# Patient Record
Sex: Female | Born: 1995
Health system: Southern US, Community
[De-identification: ages and names within clinical notes are randomized; demographics above are authoritative.]

## PROBLEM LIST (undated history)

## (undated) DIAGNOSIS — E119 Type 2 diabetes mellitus without complications: Secondary | ICD-10-CM

## (undated) DIAGNOSIS — J45909 Unspecified asthma, uncomplicated: Secondary | ICD-10-CM

## (undated) DIAGNOSIS — T7840XA Allergy, unspecified, initial encounter: Secondary | ICD-10-CM

## (undated) HISTORY — DX: Allergy, unspecified, initial encounter: T78.40XA

## (undated) HISTORY — DX: Unspecified asthma, uncomplicated: J45.909

## (undated) HISTORY — PX: OTHER SURGICAL HISTORY: SHX169

## (undated) HISTORY — PX: OSTEOTOMY: SHX137

## (undated) HISTORY — DX: Type 2 diabetes mellitus without complications: E11.9

---
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
## 2014-02-20 ENCOUNTER — Other Ambulatory Visit: Payer: Self-pay | Admitting: Family Medicine

## 2014-03-05 ENCOUNTER — Ambulatory Visit (INDEPENDENT_AMBULATORY_CARE_PROVIDER_SITE_OTHER): Payer: BLUE CROSS/BLUE SHIELD | Admitting: Emergency Medicine

## 2014-03-05 VITALS — BP 110/80 | HR 91 | Temp 98.9°F | Resp 16 | Ht 65.5 in | Wt 188.0 lb

## 2014-03-05 DIAGNOSIS — E108 Type 1 diabetes mellitus with unspecified complications: Secondary | ICD-10-CM

## 2014-03-05 LAB — POCT CBC
GRANULOCYTE PERCENT: 36.8 % — AB (ref 37–80)
HCT, POC: 40.2 % (ref 37.7–47.9)
Hemoglobin: 13.1 g/dL (ref 12.2–16.2)
Lymph, poc: 6.5 — AB (ref 0.6–3.4)
MCH, POC: 28.2 pg (ref 27–31.2)
MCHC: 32.6 g/dL (ref 31.8–35.4)
MCV: 86.3 fL (ref 80–97)
MID (cbc): 0.7 (ref 0–0.9)
MPV: 8.1 fL (ref 0–99.8)
POC GRANULOCYTE: 4.2 (ref 2–6.9)
POC LYMPH %: 56.8 % — AB (ref 10–50)
POC MID %: 6.4 %M (ref 0–12)
Platelet Count, POC: 285 10*3/uL (ref 142–424)
RBC: 4.65 M/uL (ref 4.04–5.48)
RDW, POC: 12.9 %
WBC: 11.5 10*3/uL — AB (ref 4.6–10.2)

## 2014-03-05 LAB — POCT GLYCOSYLATED HEMOGLOBIN (HGB A1C): Hemoglobin A1C: 6.3

## 2014-03-05 LAB — GLUCOSE, POCT (MANUAL RESULT ENTRY): POC Glucose: 92 mg/dl (ref 70–99)

## 2014-03-05 NOTE — Progress Notes (Addendum)
Subjective:    Patient ID: Amanda Kent, female    DOB: 10/25/1995, 19 y.o.   MRN: [id number]  HPI Chief Complaint  Patient presents with  . Follow-up  . Diabetes   This chart was scribed for Lesle ChrisSteven Daub, MD by Andrew Auaven Small, ED Scribe. This patient was seen in room 1 and the patient's care was started at 4:10 PM.  HPI Comments: Amanda Kent is a 19 y.o. female who presents to the [facility]. Pt was diagnosed with DM last year. Pt does not check sugars very often but when she does there in the 150's. Pt is a Consulting civil engineerstudent at [employer]. Pt denies exercise but does try to watch her diet. Pt's mother is also diabetic and being seen today. Per mother, they have just joined [employer]. Mother states she tries to stay on top of the pt with checking her sugars. Pt is UTD with depo.     Past Medical History  Diagnosis Date  . Allergy   . Asthma   . Diabetes mellitus without complication    Past Surgical History  Procedure Laterality Date  . Osteotomy    . Osteomax2     Prior to Admission medications   Medication Sig Start Date End Date Taking? Authorizing Provider  medroxyPROGESTERone (DEPO-PROVERA) 150 MG/ML injection Inject 150 mg into the muscle every 3 (three) months.   Yes Historical Provider, MD  metFORMIN (GLUCOPHAGE) 500 MG tablet Take 1 tablet (500 mg total) by mouth 2 (two) times daily with a meal. 10/20/13  Yes Shade FloodJeffrey R Greene, MD    Review of Systems     Objective:   Physical Exam CONSTITUTIONAL: Well developed/well nourished, very dull but does cooperate.  HEAD: Normocephalic/atraumatic EYES: EOMI/PERRL ENMT: Mucous membranes moist NECK: supple no meningeal signs SPINE/BACK:entire spine nontender CV: S1/S2 noted, no murmurs/rubs/gallops noted LUNGS: Lungs are clear to auscultation bilaterally, no apparent distress ABDOMEN: soft, nontender, no rebound or guarding, bowel sounds noted throughout abdomen GU:no cva tenderness NEURO: Pt is  awake/alert/appropriate, moves all extremitiesx4.  No facial droop.   EXTREMITIES: pulses normal/equal, full ROM, sensation normal, pulse is normal SKIN: warm, color normal PSYCH: no abnormalities of mood noted, alert and oriented to situation  Results for orders placed or performed in visit on 03/05/14  POCT CBC  Result Value Ref Range   WBC 11.5 (A) 4.6 - 10.2 K/uL   Lymph, poc 6.5 (A) 0.6 - 3.4   POC LYMPH PERCENT 56.8 (A) 10 - 50 %L   MID (cbc) 0.7 0 - 0.9   POC MID % 6.4 0 - 12 %M   POC Granulocyte 4.2 2 - 6.9   Granulocyte percent 36.8 (A) 37 - 80 %G   RBC 4.65 4.04 - 5.48 M/uL   Hemoglobin 13.1 12.2 - 16.2 g/dL   HCT, POC 82.940.2 56.237.7 - 47.9 %   MCV 86.3 80 - 97 fL   MCH, POC 28.2 27 - 31.2 pg   MCHC 32.6 31.8 - 35.4 g/dL   RDW, POC 13.012.9 %   Platelet Count, POC 285 142 - 424 K/uL   MPV 8.1 0 - 99.8 fL  POCT glucose (manual entry)  Result Value Ref Range   POC Glucose 92 70 - 99 mg/dl  POCT glycosylated hemoglobin (Hb A1C)  Result Value Ref Range   Hemoglobin A1C 6.3     Assessment & Plan:  No change in medications recheck 4 months.I personally performed the services described in this documentation,  which was scribed in my presence. The recorded information has been reviewed and is accurate.

## 2014-03-05 NOTE — Patient Instructions (Signed)

## 2014-03-06 LAB — COMPLETE METABOLIC PANEL WITH GFR
ALBUMIN: 4.4 g/dL (ref 3.5–5.2)
ALT: 10 U/L (ref 0–35)
AST: 13 U/L (ref 0–37)
Alkaline Phosphatase: 46 U/L (ref 39–117)
BILIRUBIN TOTAL: 0.3 mg/dL (ref 0.2–1.2)
BUN: 20 mg/dL (ref 6–23)
CALCIUM: 9.5 mg/dL (ref 8.4–10.5)
CHLORIDE: 104 meq/L (ref 96–112)
CO2: 23 mEq/L (ref 19–32)
CREATININE: 0.68 mg/dL (ref 0.50–1.10)
GLUCOSE: 85 mg/dL (ref 70–99)
POTASSIUM: 4 meq/L (ref 3.5–5.3)
SODIUM: 137 meq/L (ref 135–145)
TOTAL PROTEIN: 7.4 g/dL (ref 6.0–8.3)

## 2014-03-06 LAB — TSH: TSH: 2.239 u[IU]/mL (ref 0.350–4.500)

## 2014-03-06 LAB — MICROALBUMIN, URINE: Microalb, Ur: 1.4 mg/dL (ref <2.0)

## 2014-03-06 LAB — T4, FREE: Free T4: 1.11 ng/dL (ref 0.80–1.80)

## 2014-04-29 ENCOUNTER — Other Ambulatory Visit: Payer: Self-pay | Admitting: Family Medicine

## 2014-06-05 ENCOUNTER — Other Ambulatory Visit: Payer: Self-pay | Admitting: Physician Assistant

## 2014-07-13 ENCOUNTER — Other Ambulatory Visit: Payer: Self-pay | Admitting: Physician Assistant

## 2014-08-11 ENCOUNTER — Ambulatory Visit (INDEPENDENT_AMBULATORY_CARE_PROVIDER_SITE_OTHER): Payer: 59 | Admitting: Family Medicine

## 2014-08-11 VITALS — BP 108/72 | HR 50 | Temp 99.5°F | Resp 16 | Ht 66.0 in | Wt 189.0 lb

## 2014-08-11 DIAGNOSIS — E119 Type 2 diabetes mellitus without complications: Secondary | ICD-10-CM

## 2014-08-11 DIAGNOSIS — E663 Overweight: Secondary | ICD-10-CM | POA: Diagnosis not present

## 2014-08-11 LAB — GLUCOSE, POCT (MANUAL RESULT ENTRY): POC GLUCOSE: 114 mg/dL — AB (ref 70–99)

## 2014-08-11 LAB — POCT GLYCOSYLATED HEMOGLOBIN (HGB A1C): HEMOGLOBIN A1C: 7

## 2014-08-11 MED ORDER — METFORMIN HCL 500 MG PO TABS: ORAL_TABLET | ORAL | Status: DC

## 2014-08-11 NOTE — Progress Notes (Addendum)
Subjective:  This chart was scribed for Trinna Post, MD by Broadus John, Medical Scribe. This patient was seen in Room 8 and the patient's care was started at 1:27 PM.   Patient ID: Amanda Kent, female    DOB: 03-Sep-1995, 19 y.o.   MRN: [id number]  Chief Complaint  Patient presents with  . Diabetes recheck    HPI HPI Comments: Amanda Kent is a 19 y.o. female with a history of diabetes. She was initially diagnosed in July 2015, initial A1C 11.7. She was started on metformin 500 mg BID. She was seen for a follow up in October 2015 with A1C of 7.1. She was continued on same dose of metformin. Last diabetes check was in February 27th with Dr. Cleta Alberts, A1C 6.3. She was continued on 500 mg Metformin BID. Initially weight 191. Pt is up 1 lb from visit in February. Last urine microalbumin  was normal in Feb of 2016.   Today pt presents to [facility] for a diabetes check. Pt reports that she has not seen an optometrist within the last year. She notes that she last seen her dentist last year. Pt states that she regularly exercises with her mother about 3-4 times a week by walking. She indicates that she eats fast food about once or twice a week. Pt also notes that she does not eat much sweets, and she does not drink tea or soda. Pt reports that the hardest part of her diet is the kind of food she eats.  Pt denies numbness or a tingling sensation in her feet, fatigue, headache, light-headedness, or dizziness. She reports no complications with her metformin.   Filed Weights   08/11/14 1129  Weight: 189 lb (85.73 kg)   Body mass index is 30.52 kg/(m^2).    There are no active problems to display for this patient.  Past Medical History  Diagnosis Date  . Allergy   . Asthma   . Diabetes mellitus without complication    Past Surgical History  Procedure Laterality Date  . Osteotomy    . Osteomax2     No Known Allergies Prior to Admission medications     Medication Sig Start Date End Date Taking? Authorizing Provider  medroxyPROGESTERone (DEPO-PROVERA) 150 MG/ML injection Inject 150 mg into the muscle every 3 (three) months.   Yes Historical Provider, MD  metFORMIN (GLUCOPHAGE) 500 MG tablet TAKE 1 TABLET BY MOUTH TWICE DAILY.  "OV NEEDED" 07/13/14  Yes Porfirio Oar, PA-C   History   Social History  . Marital Status: Single    Spouse Name: N/A  . Number of Children: N/A  . Years of Education: N/A   Occupational History  . Not on file.   Social History Main Topics  . Smoking status: Never Smoker   . Smokeless tobacco: Not on file  . Alcohol Use: No  . Drug Use: No  . Sexual Activity: Yes    Birth Control/ Protection: Injection   Other Topics Concern  . Not on file   Social History Narrative    Review of Systems  Constitutional: Negative for fatigue and unexpected weight change.  Respiratory: Negative for chest tightness and shortness of breath.   Cardiovascular: Negative for chest pain, palpitations and leg swelling.  Gastrointestinal: Negative for abdominal pain and blood in stool.  Neurological: Negative for dizziness, syncope, light-headedness and headaches.       Objective:   Physical Exam  Constitutional: She is oriented to person, place,  and time. She appears well-developed and well-nourished. No distress.  HENT:  Head: Normocephalic and atraumatic.  Eyes: Conjunctivae and EOM are normal. Pupils are equal, round, and reactive to light.  Neck: Neck supple. Carotid bruit is not present.  Cardiovascular: Normal rate, regular rhythm, normal heart sounds and intact distal pulses.   Pulmonary/Chest: Effort normal and breath sounds normal.  Abdominal: Soft. She exhibits no pulsatile midline mass. There is no tenderness.  Musculoskeletal:  No appreciable lower extremity edema.  Microfilament testing was normal.     Neurological: She is alert and oriented to person, place, and time. No cranial nerve deficit.  Skin:  Skin is warm and dry.  Psychiatric: She has a normal mood and affect. Her behavior is normal.  Nursing note and vitals reviewed.   Filed Vitals:   08/11/14 1129  BP: 108/72  Pulse: 50  Temp: 99.5 F (37.5 C)  TempSrc: Oral  Resp: 16  Height:  (1.676 m)  Weight: 189 lb (85.73 kg)  SpO2: 98%   Results for orders placed or performed in visit on 08/11/14  POCT glucose (manual entry)  Result Value Ref Range   POC Glucose 114 (A) 70 - 99 mg/dl  POCT glycosylated hemoglobin (Hb A1C)  Result Value Ref Range   Hemoglobin A1C 7.0        Assessment & Plan:   Amanda Kent is a 19 y.o. female Diabetes type 2, controlled - Plan: POCT glucose (manual entry), POCT glycosylated hemoglobin (Hb A1C), Lipid panel, Comprehensive metabolic panel, metFORMIN (GLUCOPHAGE) 500 MG tablet  Overweight  -Less controlled diabetes. Recommended she have her A1c to remain under 6.5 based on her young age. We'll continue same dose of metformin twice a day, but will work on diet including portion control for weight loss and recheck in 3 months time. Again discussed nutritionist that she would find this helpful, but declined at present. Recheck in 3 months.  Meds ordered this encounter  Medications  . metFORMIN (GLUCOPHAGE) 500 MG tablet    Sig: TAKE 1 TABLET BY MOUTH TWICE DAILY.    Dispense:  180 tablet    Refill:  1   Patient Instructions  Your hemoglobin A1c has increased a little bit. As we discussed I would like you to be under 6.5 for ideal control true age. We can continue same dose of metformin for now, but really work on diet, and exercise as we discussed. Recheck for repeat hemoglobin A1c in the next 3 months.  Call for appointment with your eye care provider as discussed.  Diabetes Mellitus and Food It is important for you to manage your blood sugar (glucose) level. Your blood glucose level can be greatly affected by what you eat. Eating healthier foods in the appropriate amounts  throughout the day at about the same time each day will help you control your blood glucose level. It can also help slow or prevent worsening of your diabetes mellitus. Healthy eating may even help you improve the level of your blood pressure and reach or maintain a healthy weight.  HOW CAN FOOD AFFECT ME? Carbohydrates Carbohydrates affect your blood glucose level more than any other type of food. Your dietitian will help you determine how many carbohydrates to eat at each meal and teach you how to count carbohydrates. Counting carbohydrates is important to keep your blood glucose at a healthy level, especially if you are using insulin or taking certain medicines for diabetes mellitus. Alcohol Alcohol can cause sudden decreases in blood  glucose (hypoglycemia), especially if you use insulin or take certain medicines for diabetes mellitus. Hypoglycemia can be a life-threatening condition. Symptoms of hypoglycemia (sleepiness, dizziness, and disorientation) are similar to symptoms of having too much alcohol.  If your health care provider has given you approval to drink alcohol, do so in moderation and use the following guidelines:  Women should not have more than one drink per day, and men should not have more than two drinks per day. One drink is equal to:  12 oz of beer.  5 oz of wine.  1 oz of hard liquor.  Do not drink on an empty stomach.  Keep yourself hydrated. Have water, diet soda, or unsweetened iced tea.  Regular soda, juice, and other mixers might contain a lot of carbohydrates and should be counted. WHAT FOODS ARE NOT RECOMMENDED? As you make food choices, it is important to remember that all foods are not the same. Some foods have fewer nutrients per serving than other foods, even though they might have the same number of calories or carbohydrates. It is difficult to get your body what it needs when you eat foods with fewer nutrients. Examples of foods that you should avoid that  are high in calories and carbohydrates but low in nutrients include:  Trans fats (most processed foods list trans fats on the Nutrition Facts label).  Regular soda.  Juice.  Candy.  Sweets, such as cake, pie, doughnuts, and cookies.  Fried foods. WHAT FOODS CAN I EAT? Have nutrient-rich foods, which will nourish your body and keep you healthy. The food you should eat also will depend on several factors, including:  The calories you need.  The medicines you take.  Your weight.  Your blood glucose level.  Your blood pressure level.  Your cholesterol level. You also should eat a variety of foods, including:  Protein, such as meat, poultry, fish, tofu, nuts, and seeds (lean animal proteins are best).  Fruits.  Vegetables.  Dairy products, such as milk, cheese, and yogurt (low fat is best).  Breads, grains, pasta, cereal, rice, and beans.  Fats such as olive oil, trans fat-free margarine, canola oil, avocado, and olives. DOES EVERYONE WITH DIABETES MELLITUS HAVE THE SAME MEAL PLAN? Because every person with diabetes mellitus is different, there is not one meal plan that works for everyone. It is very important that you meet with a dietitian who will help you create a meal plan that is just right for you. Document Released: 09/20/2004 Document Revised: 12/29/2012 Document Reviewed: 11/20/2012 [facility] Patient Information 2015 [place], [place]. This information is not intended to replace advice given to you by your health care provider. Make sure you discuss any questions you have with your health care provider.     I personally performed the services described in this documentation, which was scribed in my presence. The recorded information has been reviewed and considered, and addended by me as needed.

## 2014-08-11 NOTE — Patient Instructions (Addendum)
Your hemoglobin A1c has increased a little bit. As we discussed I would like you to be under 6.5 for ideal control true age. We can continue same dose of metformin for now, but really work on diet, and exercise as we discussed. Recheck for repeat hemoglobin A1c in the next 3 months.  Call for appointment with your eye care provider as discussed.  Diabetes Mellitus and Food It is important for you to manage your blood sugar (glucose) level. Your blood glucose level can be greatly affected by what you eat. Eating healthier foods in the appropriate amounts throughout the day at about the same time each day will help you control your blood glucose level. It can also help slow or prevent worsening of your diabetes mellitus. Healthy eating may even help you improve the level of your blood pressure and reach or maintain a healthy weight.  HOW CAN FOOD AFFECT ME? Carbohydrates Carbohydrates affect your blood glucose level more than any other type of food. Your dietitian will help you determine how many carbohydrates to eat at each meal and teach you how to count carbohydrates. Counting carbohydrates is important to keep your blood glucose at a healthy level, especially if you are using insulin or taking certain medicines for diabetes mellitus. Alcohol Alcohol can cause sudden decreases in blood glucose (hypoglycemia), especially if you use insulin or take certain medicines for diabetes mellitus. Hypoglycemia can be a life-threatening condition. Symptoms of hypoglycemia (sleepiness, dizziness, and disorientation) are similar to symptoms of having too much alcohol.  If your health care provider has given you approval to drink alcohol, do so in moderation and use the following guidelines:  Women should not have more than one drink per day, and men should not have more than two drinks per day. One drink is equal to:  12 oz of beer.  5 oz of wine.  1 oz of hard liquor.  Do not drink on an empty  stomach.  Keep yourself hydrated. Have water, diet soda, or unsweetened iced tea.  Regular soda, juice, and other mixers might contain a lot of carbohydrates and should be counted. WHAT FOODS ARE NOT RECOMMENDED? As you make food choices, it is important to remember that all foods are not the same. Some foods have fewer nutrients per serving than other foods, even though they might have the same number of calories or carbohydrates. It is difficult to get your body what it needs when you eat foods with fewer nutrients. Examples of foods that you should avoid that are high in calories and carbohydrates but low in nutrients include:  Trans fats (most processed foods list trans fats on the Nutrition Facts label).  Regular soda.  Juice.  Candy.  Sweets, such as cake, pie, doughnuts, and cookies.  Fried foods. WHAT FOODS CAN I EAT? Have nutrient-rich foods, which will nourish your body and keep you healthy. The food you should eat also will depend on several factors, including:  The calories you need.  The medicines you take.  Your weight.  Your blood glucose level.  Your blood pressure level.  Your cholesterol level. You also should eat a variety of foods, including:  Protein, such as meat, poultry, fish, tofu, nuts, and seeds (lean animal proteins are best).  Fruits.  Vegetables.  Dairy products, such as milk, cheese, and yogurt (low fat is best).  Breads, grains, pasta, cereal, rice, and beans.  Fats such as olive oil, trans fat-free margarine, canola oil, avocado, and olives. DOES EVERYONE WITH  DIABETES MELLITUS HAVE THE SAME MEAL PLAN? Because every person with diabetes mellitus is different, there is not one meal plan that works for everyone. It is very important that you meet with a dietitian who will help you create a meal plan that is just right for you. Document Released: 09/20/2004 Document Revised: 12/29/2012 Document Reviewed: 11/20/2012 [facility] Patient  Information 2015 [place], [place]. This information is not intended to replace advice given to you by your health care provider. Make sure you discuss any questions you have with your health care provider.

## 2015-02-06 ENCOUNTER — Ambulatory Visit (INDEPENDENT_AMBULATORY_CARE_PROVIDER_SITE_OTHER): Payer: BLUE CROSS/BLUE SHIELD | Admitting: Internal Medicine

## 2015-02-06 ENCOUNTER — Encounter: Payer: Self-pay | Admitting: Internal Medicine

## 2015-02-06 VITALS — BP 122/80 | HR 68 | Temp 99.4°F | Resp 16 | Ht 65.0 in | Wt 195.0 lb

## 2015-02-06 DIAGNOSIS — Z6832 Body mass index (BMI) 32.0-32.9, adult: Secondary | ICD-10-CM

## 2015-02-06 DIAGNOSIS — E119 Type 2 diabetes mellitus without complications: Secondary | ICD-10-CM

## 2015-02-06 LAB — LIPID PANEL
CHOL/HDL RATIO: 5.5 ratio — AB (ref <=5.0)
Cholesterol: 159 mg/dL (ref 125–170)
HDL: 29 mg/dL — ABNORMAL LOW (ref >=46)
LDL CALC: 111 mg/dL — AB (ref <110)
Triglycerides: 93 mg/dL (ref <150)
VLDL: 19 mg/dL (ref <30)

## 2015-02-06 LAB — POCT GLYCOSYLATED HEMOGLOBIN (HGB A1C): HEMOGLOBIN A1C: 8.1

## 2015-02-06 NOTE — Patient Instructions (Signed)
Whole 30 diet 

## 2015-02-06 NOTE — Progress Notes (Signed)
Subjective:  By signing my name below, I, Amanda Kent, attest that this documentation has been prepared under the direction and in the presence of [name], MD.  Watt Climes Kent, Medical Scribe. 02/06/2015.  3:33 PM.   Patient ID: Amanda Kent, female    DOB: [birth year], 20 y.o.   MRN: [id number]  Chief Complaint  Patient presents with  . Annual Exam    complete physical     HPI HPI Comments: Amanda Kent is a 20 y.o. female who presents to [facility] for fu med probs At last OV Dr Green:Lashaun R Terhaar is a 20 y.o. female with a history of diabetes. She was initially diagnosed in July 2015, initial A1C 11.7. She was started on metformin 500 mg BID. She was seen for a follow up in October 2015 with A1C of 7.1. She was continued on same dose of metformin. Last diabetes check was in February 27th with Dr. Cleta Alberts, A1C 6.3. She was continued on 500 mg Metformin BID. Initially weight 191. Pt is up 1 lb from visit in February. Last urine microalbumin was normal in Feb of 2016.   Note glycosuria in [facility] visit a yr before dx  There are no active problems to display for this patient.  Pt is requesting a diabetic check. She indicates that she started taking metformin about 1 year ago. Pt notes that she has lost a few pounds since she started being on the medication. She states that she walks regularly for exercise. Pt reports no other medical problems. She denies eye issues, or sleep disturbance.   Pt is currently as Consulting civil engineer at [employer], studying Early childhood Education.    There are no active problems to display for this patient.  Past Medical History  Diagnosis Date  . Allergy   . Asthma   . Diabetes mellitus without complication    Past Surgical History  Procedure Laterality Date  . Osteotomy    . Osteomax2     No Known Allergies Prior to Admission medications   Medication Sig Start Date End Date Taking? Authorizing Provider    medroxyPROGESTERone (DEPO-PROVERA) 150 MG/ML injection Inject 150 mg into the muscle every 3 (three) months.    Historical Provider, MD  metFORMIN (GLUCOPHAGE) 500 MG tablet TAKE 1 TABLET BY MOUTH TWICE DAILY. 08/11/14   Shade Flood, MD   Social History   Social History  . Marital Status: Single    Spouse Name: N/A  . Number of Children: N/A  . Years of Education: N/A   Occupational History  . Not on file.   Social History Main Topics  . Smoking status: Never Smoker   . Smokeless tobacco: Not on file  . Alcohol Use: No  . Drug Use: No  . Sexual Activity: Yes    Birth Control/ Protection: Injection   Other Topics Concern  . Not on file   Social History Narrative    Review of Systems  Constitutional: Negative for appetite change, fatigue and unexpected weight change.  HENT: Negative for trouble swallowing.   Eyes: Negative for pain and visual disturbance.  Respiratory: Negative for shortness of breath.   Cardiovascular: Negative for chest pain, palpitations and leg swelling.  Gastrointestinal: Negative for abdominal pain.  Genitourinary: Negative for difficulty urinating and menstrual problem.  Musculoskeletal: Negative for back pain.  Neurological: Negative for headaches.  Psychiatric/Behavioral: Negative for behavioral problems, sleep disturbance and dysphoric mood. The patient is not nervous/anxious.  Objective:   Physical Exam  Constitutional: She is oriented to person, place, and time. She appears well-developed and well-nourished. No distress.  HENT:  Head: Normocephalic and atraumatic.  Mouth/Throat: Oropharynx is clear and moist.  Eyes: Conjunctivae and EOM are normal. Pupils are equal, round, and reactive to light.  Neck: Neck supple. No thyromegaly present.  Cardiovascular: Normal rate, regular rhythm, normal heart sounds and intact distal pulses.   No murmur heard. Pulmonary/Chest: Effort normal and breath sounds normal.  Neurological: She is  alert and oriented to person, place, and time. She has normal reflexes. No cranial nerve deficit. Coordination normal.  Skin: Skin is warm and dry.  Psychiatric: She has a normal mood and affect. Her behavior is normal. Thought content normal.  Nursing note and vitals reviewed.   BP 122/80 mmHg  Pulse 68  Temp(Src) 99.4 F (37.4 C) (Oral)  Resp 16  Ht  (1.651 m)  Wt 195 lb (88.451 kg)  BMI 32.45 kg/m2  SpO2 98%     Assessment & Plan:  Type 2 diabetes mellitus without complication, without long-term current use of insulin (HCC) - Plan: POCT glycosylated hemoglobin (Hb A1C), Lipid panel  BMI 32.0-32.9,adult  To focus on wt loss via whole 30 plus exercise  Results for orders placed or performed in visit on 02/06/15  Lipid panel  Result Value Ref Range   Cholesterol 159 125 - 170 mg/dL   Triglycerides 93 <098 mg/dL   HDL 29 (L) >=11 mg/dL   Total CHOL/HDL Ratio 5.5 (H) <=5.0 Ratio   VLDL 19 <30 mg/dL   LDL Cholesterol 914 (H) <110 mg/dL  POCT glycosylated hemoglobin (Hb A1C)  Result Value Ref Range   Hemoglobin A1C 8.1    Will see if dm control reduces LDL for now Consider ACE and ststin Optometry with Kent  Meds ordered this encounter  Medications  . metFORMIN (GLUCOPHAGE) 1000 MG tablet    Sig: TAKE 1 TABLET BY MOUTH TWICE DAILY.    Dispense:  180 tablet    Refill:  1   Fu 6 m I have completed the patient encounter in its entirety as documented by the scribe, with editing by me where necessary. Oskar Cretella P. Merla Riches, M.D.

## 2015-02-07 ENCOUNTER — Encounter: Payer: Self-pay | Admitting: Internal Medicine

## 2015-02-07 DIAGNOSIS — Z6832 Body mass index (BMI) 32.0-32.9, adult: Secondary | ICD-10-CM | POA: Insufficient documentation

## 2015-02-07 DIAGNOSIS — E119 Type 2 diabetes mellitus without complications: Secondary | ICD-10-CM | POA: Insufficient documentation

## 2015-02-07 MED ORDER — METFORMIN HCL 1000 MG PO TABS: ORAL_TABLET | ORAL | Status: DC

## 2015-02-08 ENCOUNTER — Telehealth: Payer: Self-pay

## 2015-02-08 NOTE — Telephone Encounter (Signed)
-----   Message from Tonye Pearson, MD sent at 02/07/2015  1:13 PM EST ----- Let her know I sent in new rx for metformin at higher dose as A1C was 8 yesterday   Reck 6 mos

## 2015-02-08 NOTE — Telephone Encounter (Signed)
Called and advised pt on VM of new dose and f/up plan. Asked for CB w/any ?s.

## 2015-06-12 ENCOUNTER — Other Ambulatory Visit: Payer: Self-pay | Admitting: Family Medicine

## 2015-07-14 ENCOUNTER — Ambulatory Visit (INDEPENDENT_AMBULATORY_CARE_PROVIDER_SITE_OTHER): Payer: BLUE CROSS/BLUE SHIELD | Admitting: *Deleted

## 2015-07-14 VITALS — BP 114/76 | HR 74 | Temp 98.5°F | Ht 65.0 in | Wt 193.0 lb

## 2015-07-14 DIAGNOSIS — Z3042 Encounter for surveillance of injectable contraceptive: Secondary | ICD-10-CM

## 2015-07-14 MED ORDER — MEDROXYPROGESTERONE ACETATE 150 MG/ML IM SUSP
150.0000 mg | Freq: Once | INTRAMUSCULAR | Status: AC
  Administered 2015-07-14: 150 mg via INTRAMUSCULAR

## 2015-07-14 NOTE — Progress Notes (Signed)
Pt is new to our office, Dr Gaynell FaceMarshall transfer. Pt is here today for her depo injection. Pt is on time for her injection.  Pt was advised of this office policy on depo for contraception.   Pt was advised that she will need an AEX with or before her next depo injection.  Injection was given, pt tolerated well.  Pt advised to RTO on 10-05-15 for next depo. Pt states understanding.  BP 114/76 mmHg  Pulse 74  Temp(Src) 98.5 F (36.9 C)  Ht 5\' 5"  (1.651 m)  Wt 193 lb (87.544 kg)  BMI 32.12 kg/m2  Administrations This Visit    medroxyPROGESTERone (DEPO-PROVERA) injection 150 mg    Admin Date Action Dose Route Administered By         07/14/2015 Given 150 mg Intramuscular Lanney GinsSuzanne D Reeve Mallo, CMA

## 2015-09-29 LAB — LAB REPORT - SCANNED

## 2015-10-03 ENCOUNTER — Other Ambulatory Visit: Payer: Self-pay

## 2015-10-03 ENCOUNTER — Other Ambulatory Visit: Payer: Self-pay | Admitting: Physician Assistant

## 2015-10-03 MED ORDER — MEDROXYPROGESTERONE ACETATE 150 MG/ML IM SUSP
150.0000 mg | INTRAMUSCULAR | 0 refills | Status: DC
Start: 2015-10-03 — End: 2015-10-06

## 2015-10-03 NOTE — Progress Notes (Unsigned)
One Depo refill sent to pharmacy.

## 2015-10-06 ENCOUNTER — Ambulatory Visit (INDEPENDENT_AMBULATORY_CARE_PROVIDER_SITE_OTHER): Payer: BLUE CROSS/BLUE SHIELD | Admitting: Certified Nurse Midwife

## 2015-10-06 ENCOUNTER — Encounter: Payer: Self-pay | Admitting: Certified Nurse Midwife

## 2015-10-06 VITALS — BP 123/82 | HR 78 | Temp 98.6°F | Wt 191.4 lb

## 2015-10-06 DIAGNOSIS — Z3042 Encounter for surveillance of injectable contraceptive: Secondary | ICD-10-CM | POA: Diagnosis not present

## 2015-10-06 DIAGNOSIS — Z01419 Encounter for gynecological examination (general) (routine) without abnormal findings: Secondary | ICD-10-CM

## 2015-10-06 DIAGNOSIS — E119 Type 2 diabetes mellitus without complications: Secondary | ICD-10-CM

## 2015-10-06 DIAGNOSIS — Z113 Encounter for screening for infections with a predominantly sexual mode of transmission: Secondary | ICD-10-CM

## 2015-10-06 MED ORDER — MEDROXYPROGESTERONE ACETATE 150 MG/ML IM SUSP: 150.0000 mg | INTRAMUSCULAR | 2 refills | Status: DC

## 2015-10-06 MED ORDER — MEDROXYPROGESTERONE ACETATE 150 MG/ML IM SUSP
150.0000 mg | Freq: Once | INTRAMUSCULAR | Status: AC
  Administered 2015-10-06: 150 mg via INTRAMUSCULAR

## 2015-10-06 NOTE — Progress Notes (Signed)
Subjective:      Amanda Kent is a 20 y.o. female here for a routine exam.  Current complaints: none.  Has been on Depo injections since she was 85-34 years of age.  Denies any hx of AUB or irregular periods.  Discussed Depo holiday since she has been on Depo injections for some time.  Does not have a primary care provider.  Is employed and going to school.   Last year HGA1C: was 6.2 last year.   Desires full STD screening exam.    Personal health questionnaire:  Is patient [name], have a family history of breast and/or ovarian cancer: no Is there a family history of uterine cancer diagnosed at age < 68, gastrointestinal cancer, urinary tract cancer, family member who is a Personnel officer syndrome-associated carrier: no Is the patient overweight and hypertensive, family history of diabetes, personal history of gestational diabetes, preeclampsia or PCOS: yes Is patient over 25, have PCOS,  family history of premature CHD under age 55, diabetes, smoke, have hypertension or peripheral artery disease:  yes At any time, has a partner hit, kicked or otherwise hurt or frightened you?: no Over the past 2 weeks, have you felt down, depressed or hopeless?: no Over the past 2 weeks, have you felt little interest or pleasure in doing things?:no   Gynecologic History No LMP recorded. Patient has had an injection. Contraception: Depo-Provera injections Last Pap: 04/12/14. Results were: normal Last mammogram: N/A.   Obstetric History OB History  No data available    Past Medical History:  Diagnosis Date  . Allergy   . Asthma   . Diabetes mellitus without complication [facility])     Past Surgical History:  Procedure Laterality Date  . osteomax2    . OSTEOTOMY       Current Outpatient Prescriptions:  .  medroxyPROGESTERone (DEPO-PROVERA) 150 MG/ML injection, Inject 1 mL (150 mg total) into the muscle every 3 (three) months., Disp: 1 mL, Rfl: 2 .  metFORMIN (GLUCOPHAGE) 500 MG tablet, TAKE 1  TABLET BY MOUTH TWICE DAILY, Disp: 30 tablet, Rfl: 0 No Known Allergies  Social History  Substance Use Topics  . Smoking status: Never Smoker  . Smokeless tobacco: Never Used  . Alcohol use No    Family History  Problem Relation Age of Onset  . Diabetes Mother   . Heart disease Mother   . Hypertension Mother       Review of Systems  Constitutional: negative for fatigue and weight loss Respiratory: negative for cough and wheezing Cardiovascular: negative for chest pain, fatigue and palpitations Gastrointestinal: negative for abdominal pain and change in bowel habits Musculoskeletal:negative for myalgias Neurological: negative for gait problems and tremors Behavioral/Psych: negative for abusive relationship, depression Endocrine: negative for temperature intolerance   Genitourinary:negative for abnormal menstrual periods, genital lesions, hot flashes, sexual problems and vaginal discharge Integument/breast: negative for breast lump, breast tenderness, nipple discharge and skin lesion(s)    Objective:       BP 123/82   Pulse 78   Temp 98.6 F (37 C) (Oral)   Wt 191 lb 6.4 oz (86.8 kg)   BMI 31.85 kg/m  General:   alert  Skin:   no rash or abnormalities  Lungs:   clear to auscultation bilaterally  Heart:   regular rate and rhythm, S1, S2 normal, no murmur, click, rub or gallop  Breasts:   normal without suspicious masses, skin or nipple changes or axillary nodes  Abdomen:  normal findings: no organomegaly, soft,  non-tender and no hernia  Pelvis:  External genitalia: normal general appearance Urinary system: urethral meatus normal and bladder without fullness, nontender Vaginal: normal without tenderness, induration or masses Cervix: normal appearance Adnexa: normal bimanual exam Uterus: anteverted and non-tender, normal size   Lab Review Urine pregnancy test Labs reviewed yes Radiologic studies reviewed no  50% of 30 min visit spent on counseling and coordination  of care.   Assessment:    Healthy female exam.   Contraception management  STD screening exam   H/O DM2 Plan:    Education reviewed: calcium supplements, depression evaluation, low fat, low cholesterol diet, safe sex/STD prevention, self breast exams, skin cancer screening and weight bearing exercise. Contraception: Depo-Provera injections. Follow up in: 6 months.   Meds ordered this encounter  Medications  . medroxyPROGESTERone (DEPO-PROVERA) 150 MG/ML injection    Sig: Inject 1 mL (150 mg total) into the muscle every 3 (three) months.    Dispense:  1 mL    Refill:  2   Orders Placed This Encounter  Procedures  . Hepatitis B surface antigen  . RPR  . Hepatitis C antibody  . HIV antibody  . Hemoglobin A1c  . Ambulatory referral to Internal Medicine    Referral Priority:   Routine    Referral Type:   Consultation    Referral Reason:   Specialty Services Required    Requested Specialty:   Internal Medicine    Number of Visits Requested:   1   Need to obtain previous records Possible management options include:LARK: ie Nexplanon Follow up in February to switch contraception.

## 2015-10-06 NOTE — Addendum Note (Signed)
Addended by: Marya LandryFOSTER, Alieu Finnigan D on: 10/06/2015 04:30 PM   Modules accepted: Orders

## 2015-10-10 ENCOUNTER — Other Ambulatory Visit: Payer: Self-pay | Admitting: Certified Nurse Midwife

## 2015-10-10 ENCOUNTER — Ambulatory Visit: Payer: BLUE CROSS/BLUE SHIELD | Admitting: Certified Nurse Midwife

## 2015-10-10 DIAGNOSIS — B9689 Other specified bacterial agents as the cause of diseases classified elsewhere: Secondary | ICD-10-CM

## 2015-10-10 DIAGNOSIS — N760 Acute vaginitis: Principal | ICD-10-CM

## 2015-10-10 LAB — NUSWAB VG, CANDIDA 6SP
CANDIDA GLABRATA, NAA: NEGATIVE
CANDIDA KRUSEI, NAA: NEGATIVE
CANDIDA LUSITANIAE, NAA: NEGATIVE
CANDIDA PARAPSILOSIS, NAA: NEGATIVE
CANDIDA TROPICALIS, NAA: NEGATIVE
Candida albicans, NAA: NEGATIVE
Megasphaera 1: HIGH Score — AB
Trich vag by NAA: NEGATIVE

## 2015-10-10 MED ORDER — METRONIDAZOLE 500 MG PO TABS: 500.0000 mg | ORAL_TABLET | Freq: Two times a day (BID) | ORAL | 0 refills | Status: DC

## 2015-10-12 LAB — PAP IG W/ RFLX HPV ASCU: PAP SMEAR COMMENT: 0

## 2015-10-23 ENCOUNTER — Encounter: Payer: Self-pay | Admitting: *Deleted

## 2015-11-09 ENCOUNTER — Other Ambulatory Visit: Payer: Self-pay

## 2015-11-09 MED ORDER — METFORMIN HCL 500 MG PO TABS: 500.0000 mg | ORAL_TABLET | Freq: Two times a day (BID) | ORAL | 0 refills | Status: DC

## 2015-11-09 NOTE — Telephone Encounter (Signed)
Amanda Kent asked me to check on this pt to see if we have received a  req for refill of metforming. I advised that we have not gotten a request, but pt is overdue for f/up and needs to est care with new provider. I had Corrie DandyMary advise that I will send 1 wk refill to give her time to get in. Done.

## 2015-11-21 ENCOUNTER — Ambulatory Visit (INDEPENDENT_AMBULATORY_CARE_PROVIDER_SITE_OTHER): Payer: BLUE CROSS/BLUE SHIELD | Admitting: Physician Assistant

## 2015-11-21 VITALS — BP 120/74 | HR 96 | Temp 98.3°F | Resp 16 | Ht 64.0 in | Wt 190.6 lb

## 2015-11-21 DIAGNOSIS — Z6832 Body mass index (BMI) 32.0-32.9, adult: Secondary | ICD-10-CM

## 2015-11-21 DIAGNOSIS — E119 Type 2 diabetes mellitus without complications: Secondary | ICD-10-CM | POA: Diagnosis not present

## 2015-11-21 LAB — COMPLETE METABOLIC PANEL WITHOUT GFR
ALT: 9 U/L (ref 6–29)
AST: 13 U/L (ref 10–30)
Albumin: 4.7 g/dL (ref 3.6–5.1)
Alkaline Phosphatase: 55 U/L (ref 33–115)
BUN: 19 mg/dL (ref 7–25)
CO2: 25 mmol/L (ref 20–31)
Glucose, Bld: 127 mg/dL — ABNORMAL HIGH (ref 65–99)
Sodium: 136 mmol/L (ref 135–146)
Total Bilirubin: 0.6 mg/dL (ref 0.2–1.2)
Total Protein: 7.6 g/dL (ref 6.1–8.1)

## 2015-11-21 LAB — COMPLETE METABOLIC PANEL WITH GFR
Calcium: 9.8 mg/dL (ref 8.6–10.2)
Chloride: 103 mmol/L (ref 98–110)
Creat: 0.75 mg/dL (ref 0.50–1.10)
GFR, Est African American: >89 mL/min (ref >=60)
GFR, Est Non African American: >89 mL/min (ref >=60)
Potassium: 4.1 mmol/L (ref 3.5–5.3)

## 2015-11-21 LAB — POCT GLYCOSYLATED HEMOGLOBIN (HGB A1C): Hemoglobin A1C: 7.1

## 2015-11-21 LAB — LIPID PANEL
Cholesterol: 143 mg/dL (ref <200)
HDL: 33 mg/dL — ABNORMAL LOW (ref >50)
LDL Cholesterol: 99 mg/dL (ref <100)
Total CHOL/HDL Ratio: 4.3 Ratio (ref <5.0)
Triglycerides: 56 mg/dL (ref <150)
VLDL: 11 mg/dL (ref <30)

## 2015-11-21 LAB — GLUCOSE, POCT (MANUAL RESULT ENTRY): POC Glucose: 135 mg/dl — AB (ref 70–99)

## 2015-11-21 MED ORDER — METFORMIN HCL 1000 MG PO TABS: 1000.0000 mg | ORAL_TABLET | Freq: Two times a day (BID) | ORAL | 3 refills | Status: DC

## 2015-11-21 NOTE — Patient Instructions (Addendum)
Please increase your daily dose of Metformin to 1000 MG twice a day. Please make an effort to eat a healthy diet - this plus exercise is the best thing you can do to control your sugars. See below for tips about food choices.  If your A1C continues to rise at your future appointments we will have to add another medication to your treatment.     IF you received an x-ray today, you will receive an invoice from [facility]. Please contact [facility] at [phone] with questions or concerns regarding your invoice.   IF you received labwork today, you will receive an invoice from [facility]. Please contact [facility] at [phone] with questions or concerns regarding your invoice.   Our billing staff will not be able to assist you with questions regarding bills from these companies.  You will be contacted with the lab results as soon as they are available. The fastest way to get your results is to activate your My Chart account. Instructions are located on the last page of this paperwork. If you have not heard from us regarding the results in 2 weeks, please contact this office.     Diabetes Mellitus and Food It is important for you to manage your blood sugar (glucose) level. Your blood glucose level can be greatly affected by what you eat. Eating healthier foods in the appropriate amounts throughout the day at about the same time each day will help you control your blood glucose level. It can also help slow or prevent worsening of your diabetes mellitus. Healthy eating may even help you improve the level of your blood pressure and reach or maintain a healthy weight. General recommendations for healthful eating and cooking habits include:  Eating meals and snacks regularly. Avoid going long periods of time without eating to lose weight.  Eating a diet that consists mainly of plant-based foods, such as fruits, vegetables, nuts, legumes, and whole  grains.  Using low-heat cooking methods, such as baking, instead of high-heat cooking methods, such as deep frying. Work with your dietitian to make sure you understand how to use the Nutrition Facts information on food labels.  What foods are not recommended? As you make food choices, it is important to remember that all foods are not the same. Some foods have fewer nutrients per serving than other foods, even though they might have the same number of calories or carbohydrates. It is difficult to get your body what it needs when you eat foods with fewer nutrients. Examples of foods that you should avoid that are high in calories and carbohydrates but low in nutrients include:  Trans fats (most processed foods list trans fats on the Nutrition Facts label).  Regular soda.  Juice.  Candy.  Sweets, such as cake, pie, doughnuts, and cookies.  Fried foods. What foods can I eat? Eat nutrient-rich foods, which will nourish your body and keep you healthy. You should eat a variety of foods, including:  Protein.  Lean cuts of meat.  Proteins low in saturated fats, such as fish, egg whites, and beans. Avoid processed meats.  Fruits and vegetables.  Fruits and vegetables that may help control blood glucose levels, such as apples, mangoes, and yams.  Dairy products.  Choose fat-free or low-fat dairy products, such as milk, yogurt, and cheese.  Grains, bread, pasta, and rice.  Choose whole grain products, such as multigrain bread, whole oats, and brown rice. These foods may help control blood pressure.  Fats.  Foods containing healthful fats, such as nuts, avocado, olive oil, canola oil, and fish.   Carbohydrate Counting for Diabetes Mellitus, Adult Carbohydrate counting is a method for keeping track of how many carbohydrates you eat. Eating carbohydrates naturally increases the amount of sugar (glucose) in the blood. Counting how many carbohydrates you eat helps keep your blood  glucose within normal limits, which helps you manage your diabetes (diabetes mellitus). It is important to know how many carbohydrates you can safely have in each meal. This is different for every person. A diet and nutrition specialist (registered dietitian) can help you make a meal plan and calculate how many carbohydrates you should have at each meal and snack. Carbohydrates are found in the following foods:  Grains, such as breads and cereals.  Dried beans and soy products.  Starchy vegetables, such as potatoes, peas, and corn.  Fruit and fruit juices.  Milk and yogurt.  Sweets and snack foods, such as cake, cookies, candy, chips, and soft drinks. How do I count carbohydrates? There are two ways to count carbohydrates in food. You can use either of the methods or a combination of both. Reading "Nutrition Facts" on packaged food  The "Nutrition Facts" list is included on the labels of almost all packaged foods and beverages in the [place] It includes:  The serving size.  Information about nutrients in each serving, including the grams (g) of carbohydrate per serving. To use the "Nutrition Facts":  Decide how many servings you will have.  Multiply the number of servings by the number of carbohydrates per serving.  The resulting number is the total amount of carbohydrates that you will be having. Learning standard serving sizes of other foods  When you eat foods containing carbohydrates that are not packaged or do not include "Nutrition Facts" on the label, you need to measure the servings in order to count the amount of carbohydrates:  Measure the foods that you will eat with a food scale or measuring cup, if needed.  Decide how many standard-size servings you will eat.  Multiply the number of servings by 15. Most carbohydrate-rich foods have about 15 g of carbohydrates per serving.  For example, if you eat 8 oz (170 g) of strawberries, you will have eaten 2 servings and 30 g of  carbohydrates (2 servings x 15 g = 30 g).  For foods that have more than one food mixed, such as soups and casseroles, you must count the carbohydrates in each food that is included. The following list contains standard serving sizes of common carbohydrate-rich foods. Each of these servings has about 15 g of carbohydrates:   hamburger bun or  English muffin.   oz (15 mL) syrup.   oz (14 g) jelly.  1 slice of bread.  1 six-inch tortilla.  3 oz (85 g) cooked rice or pasta.  4 oz (113 g) cooked dried beans.  4 oz (113 g) starchy vegetable, such as peas, corn, or potatoes.  4 oz (113 g) hot cereal.  4 oz (113 g) mashed potatoes or  of a large baked potato.  4 oz (113 g) canned or frozen fruit.  4 oz (120 mL) fruit juice.  4-6 crackers.  6 chicken nuggets.  6 oz (170 g) unsweetened dry cereal.  6 oz (170 g) plain fat-free yogurt or yogurt sweetened with artificial sweeteners.  8 oz (240 mL) milk.  8 oz (170 g) fresh fruit or one small piece of fruit.  24 oz (680 g) popped  popcorn. Example of carbohydrate counting Sample meal  3 oz (85 g) chicken breast.  6 oz (170 g) brown rice.  4 oz (113 g) corn.  8 oz (240 mL) milk.  8 oz (170 g) strawberries with sugar-free whipped topping. Carbohydrate calculation 1. Identify the foods that contain carbohydrates:  Rice.  Corn.  Milk.  Strawberries. 2. Calculate how many servings you have of each food:  2 servings rice.  1 serving corn.  1 serving milk.  1 serving strawberries. 3. Multiply each number of servings by 15 g:  2 servings rice x 15 g = 30 g.  1 serving corn x 15 g = 15 g.  1 serving milk x 15 g = 15 g.  1 serving strawberries x 15 g = 15 g. 4. Add together all of the amounts to find the total grams of carbohydrates eaten:  30 g + 15 g + 15 g + 15 g = 75 g of carbohydrates total. This information is not intended to replace advice given to you by your health care provider. Make sure  you discuss any questions you have with your health care provider.

## 2015-11-21 NOTE — Progress Notes (Signed)
Amanda Kent  MRN: [id number] DOB: 18-Sep-1995  PCP: Amanda PalingEmily H Thompson, MD  Subjective:  Pt is a 20 year old female who presents to clinic for follow-up diabetes.  She was last here nine months ago, seen by Dr. Merla Kent. A1C at that time was 8.1. She is currently taking Metformin 500 mg BID, however Dr. Netta Kent's not requested her to take 1000 MG BID.  Does not exercise, due to her busy schedule of school and work.  Tries to eat healthy, but is not doing a good job. Eats egg sandwiches, sandwiches.  Does not check home sugars.  Denies abdominal pain, vision changes, nausea, vomiting, increased urination, numbness, tingling.   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 Is trying to transfer to [employer] next year for their four-year course. 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   Review of Systems  Constitutional: Negative for chills, diaphoresis, fatigue and fever.  HENT: Negative for congestion, postnasal drip, rhinorrhea, sinus pressure, sneezing and sore throat.   Respiratory: Negative for cough, chest tightness, shortness of breath and wheezing.   Cardiovascular: Negative for chest pain and palpitations.  Gastrointestinal: Negative for abdominal pain, diarrhea, nausea and vomiting.  Endocrine: Negative for polydipsia, polyphagia and polyuria.  Neurological: Negative for weakness, light-headedness and headaches.    Patient Active Problem List   Diagnosis Date Noted  . Type 2 diabetes mellitus without complication, without long-term current use of insulin (HCC) 02/07/2015  . BMI 32.0-32.9,adult 02/07/2015    Current Outpatient Prescriptions on File Prior to Visit  Medication Sig Dispense Refill  . medroxyPROGESTERone (DEPO-PROVERA) 150 MG/ML injection Inject 1 mL (150 mg total) into the muscle every 3 (three) months. 1 mL 2  . metFORMIN (GLUCOPHAGE) 500 MG tablet Take 1 tablet (500 mg total) by mouth 2 (two) times daily. 14 tablet 0   No current  facility-administered medications on file prior to visit.     No Known Allergies   Objective:  BP 120/74 (BP Location: Right Arm, Patient Position: Sitting, Cuff Size: Large)   Pulse 96   Temp 98.3 F (36.8 C) (Oral)   Resp 16   Ht 5\' 4"  (1.626 m)   Wt 190 lb 9.6 oz (86.5 kg)   SpO2 99%   BMI 32.72 kg/m   Physical Exam  Constitutional: She is oriented to person, place, and time and well-developed, well-nourished, and in no distress. No distress.  Cardiovascular: Normal rate, regular rhythm and normal heart sounds.   Pulmonary/Chest: Effort normal. No respiratory distress.  Neurological: She is alert and oriented to person, place, and time. GCS score is 15.  Skin: Skin is warm and dry.  Psychiatric: Mood, memory, affect and judgment normal.  Vitals reviewed.  Results for orders placed or performed in visit on 11/21/15  POCT glycosylated hemoglobin (Hb A1C)  Result Value Ref Range   Hemoglobin A1C 7.1   POCT glucose (manual entry)  Result Value Ref Range   POC Glucose 135 (A) 70 - 99 mg/dl    Assessment and Plan :  1. Type 2 diabetes mellitus without complication, without long-term current use of insulin (HCC) 2. BMI 32.0-32.9,adult - POCT glycosylated hemoglobin (Hb A1C) - Lipid panel - COMPLETE METABOLIC PANEL WITH GFR - Microalbumin, urine - POCT glucose (manual entry) - metFORMIN (GLUCOPHAGE) 1000 MG tablet; Take 1 tablet (1,000 mg total) by mouth 2 (two) times daily with a meal.  Dispense: 180 tablet; Refill: 3 - Increase her daily dose of Metformin to 1000 MG  BID. Discussed with patient importance of aggressive control of her blood sugar considering her young age. Encouraged eating a better diet and getting regular exercise. Will contact patient with lipid results, consider Statin therapy. RTC in three moths for f/u.    Amanda CollieWhitney Amanda Kilian, PA-C  [facility] 11/21/2015 1:45 PM

## 2015-11-22 LAB — MICROALBUMIN, URINE: Microalb, Ur: 1.5 mg/dL

## 2015-11-24 ENCOUNTER — Encounter: Payer: Self-pay | Admitting: Physician Assistant

## 2016-01-02 ENCOUNTER — Ambulatory Visit (INDEPENDENT_AMBULATORY_CARE_PROVIDER_SITE_OTHER): Payer: BLUE CROSS/BLUE SHIELD

## 2016-01-02 DIAGNOSIS — Z3042 Encounter for surveillance of injectable contraceptive: Secondary | ICD-10-CM | POA: Diagnosis not present

## 2016-01-02 MED ORDER — MEDROXYPROGESTERONE ACETATE 150 MG/ML IM SUSP
150.0000 mg | Freq: Once | INTRAMUSCULAR | Status: AC
  Administered 2016-01-02: 150 mg via INTRAMUSCULAR

## 2016-01-02 NOTE — Progress Notes (Signed)
Next Depo Due March 13-27

## 2016-03-19 ENCOUNTER — Ambulatory Visit (INDEPENDENT_AMBULATORY_CARE_PROVIDER_SITE_OTHER): Payer: BLUE CROSS/BLUE SHIELD

## 2016-03-19 VITALS — BP 134/80 | HR 89 | Wt 190.0 lb

## 2016-03-19 DIAGNOSIS — Z3042 Encounter for surveillance of injectable contraceptive: Secondary | ICD-10-CM

## 2016-03-19 MED ORDER — MEDROXYPROGESTERONE ACETATE 150 MG/ML IM SUSP
150.0000 mg | Freq: Once | INTRAMUSCULAR | Status: AC
  Administered 2016-03-19: 150 mg via INTRAMUSCULAR

## 2016-03-19 NOTE — Progress Notes (Signed)
Patient presents for DEPO Injection. Injection given in RUOQ. Tolerated well.    Next Injection 5/29-6/12/2016  Administrations This Visit    medroxyPROGESTERone (DEPO-PROVERA) injection 150 mg    Admin Date 03/19/2016 Action Given Dose 150 mg Route Intramuscular Administered By Maretta Beesarol J Maahi Lannan, RMA

## 2016-06-02 ENCOUNTER — Other Ambulatory Visit: Payer: Self-pay | Admitting: Obstetrics

## 2016-06-04 ENCOUNTER — Ambulatory Visit (INDEPENDENT_AMBULATORY_CARE_PROVIDER_SITE_OTHER): Payer: BLUE CROSS/BLUE SHIELD

## 2016-06-04 ENCOUNTER — Other Ambulatory Visit (HOSPITAL_COMMUNITY)
Admission: RE | Admit: 2016-06-04 | Discharge: 2016-06-04 | Disposition: A | Discharge status: Home / Self Care | Payer: BLUE CROSS/BLUE SHIELD | Source: Ambulatory Visit | Attending: Certified Nurse Midwife | Admitting: Certified Nurse Midwife

## 2016-06-04 DIAGNOSIS — N898 Other specified noninflammatory disorders of vagina: Secondary | ICD-10-CM | POA: Insufficient documentation

## 2016-06-04 DIAGNOSIS — Z113 Encounter for screening for infections with a predominantly sexual mode of transmission: Secondary | ICD-10-CM

## 2016-06-04 DIAGNOSIS — Z3042 Encounter for surveillance of injectable contraceptive: Secondary | ICD-10-CM | POA: Diagnosis not present

## 2016-06-04 MED ORDER — MEDROXYPROGESTERONE ACETATE 150 MG/ML IM SUSP
150.0000 mg | Freq: Once | INTRAMUSCULAR | Status: AC
  Administered 2016-06-04: 150 mg via INTRAMUSCULAR

## 2016-06-04 NOTE — Progress Notes (Signed)
Nurse visit for pt supplied Depo. Pt is on time for inj. Depo given L del w/o complaints. Next Depo due Aug 20. During visit, pt c/o vaginal disharge and requested all STD testing today. Consulted with CNM, self swab and STD blood work completed.

## 2016-06-05 LAB — CERVICOVAGINAL ANCILLARY ONLY
BACTERIAL VAGINITIS: NEGATIVE
CANDIDA VAGINITIS: NEGATIVE
CHLAMYDIA, DNA PROBE: NEGATIVE
NEISSERIA GONORRHEA: NEGATIVE
Trichomonas: NEGATIVE

## 2016-06-05 LAB — RPR: RPR: NONREACTIVE

## 2016-06-05 LAB — HEPATITIS B SURFACE ANTIGEN: Hepatitis B Surface Ag: NEGATIVE

## 2016-06-05 LAB — HIV ANTIBODY (ROUTINE TESTING W REFLEX): HIV Screen 4th Generation wRfx: NONREACTIVE

## 2016-06-05 LAB — HEPATITIS C ANTIBODY: Hep C Virus Ab: <0.1 s/co ratio (ref 0.0–0.9)

## 2016-07-14 ENCOUNTER — Emergency Department (HOSPITAL_COMMUNITY)
Admission: EM | Admit: 2016-07-14 | Discharge: 2016-07-14 | Disposition: A | Discharge status: Home / Self Care | Payer: BLUE CROSS/BLUE SHIELD | Attending: Emergency Medicine | Admitting: Emergency Medicine

## 2016-07-14 ENCOUNTER — Encounter (HOSPITAL_COMMUNITY): Payer: Self-pay

## 2016-07-14 DIAGNOSIS — R112 Nausea with vomiting, unspecified: Secondary | ICD-10-CM | POA: Insufficient documentation

## 2016-07-14 DIAGNOSIS — E119 Type 2 diabetes mellitus without complications: Secondary | ICD-10-CM | POA: Insufficient documentation

## 2016-07-14 DIAGNOSIS — Z7984 Long term (current) use of oral hypoglycemic drugs: Secondary | ICD-10-CM | POA: Insufficient documentation

## 2016-07-14 DIAGNOSIS — R197 Diarrhea, unspecified: Secondary | ICD-10-CM | POA: Insufficient documentation

## 2016-07-14 DIAGNOSIS — J45909 Unspecified asthma, uncomplicated: Secondary | ICD-10-CM | POA: Insufficient documentation

## 2016-07-14 LAB — COMPREHENSIVE METABOLIC PANEL
ALT: 11 U/L — ABNORMAL LOW (ref 14–54)
AST: 14 U/L — ABNORMAL LOW (ref 15–41)
Albumin: 4.4 g/dL (ref 3.5–5.0)
Alkaline Phosphatase: 59 U/L (ref 38–126)
Anion gap: 8 (ref 5–15)
BILIRUBIN TOTAL: 0.5 mg/dL (ref 0.3–1.2)
BUN: 19 mg/dL (ref 6–20)
CHLORIDE: 109 mmol/L (ref 101–111)
CO2: 22 mmol/L (ref 22–32)
CREATININE: 0.7 mg/dL (ref 0.44–1.00)
Calcium: 9.3 mg/dL (ref 8.9–10.3)
Glucose, Bld: 224 mg/dL — ABNORMAL HIGH (ref 65–99)
POTASSIUM: 3.9 mmol/L (ref 3.5–5.1)
Sodium: 139 mmol/L (ref 135–145)
TOTAL PROTEIN: 8.1 g/dL (ref 6.5–8.1)

## 2016-07-14 LAB — URINALYSIS, ROUTINE W REFLEX MICROSCOPIC
Bilirubin Urine: NEGATIVE
GLUCOSE, UA: NEGATIVE mg/dL
Hgb urine dipstick: NEGATIVE
KETONES UR: NEGATIVE mg/dL
LEUKOCYTES UA: NEGATIVE
NITRITE: NEGATIVE
PROTEIN: NEGATIVE mg/dL
Specific Gravity, Urine: 1.025 (ref 1.005–1.030)
pH: 5 (ref 5.0–8.0)

## 2016-07-14 LAB — CBC
HEMATOCRIT: 41 % (ref 36.0–46.0)
Hemoglobin: 13.8 g/dL (ref 12.0–15.0)
MCH: 28.4 pg (ref 26.0–34.0)
MCHC: 33.7 g/dL (ref 30.0–36.0)
MCV: 84.4 fL (ref 78.0–100.0)
PLATELETS: 292 10*3/uL (ref 150–400)
RBC: 4.86 MIL/uL (ref 3.87–5.11)
RDW: 12.4 % (ref 11.5–15.5)
WBC: 10 10*3/uL (ref 4.0–10.5)

## 2016-07-14 LAB — I-STAT BETA HCG BLOOD, ED (MC, WL, AP ONLY): I-stat hCG, quantitative: <5 m[IU]/mL (ref <5)

## 2016-07-14 LAB — LIPASE, BLOOD: LIPASE: 29 U/L (ref 11–51)

## 2016-07-14 MED ORDER — ONDANSETRON 4 MG PO TBDP: 4.0000 mg | ORAL_TABLET | Freq: Three times a day (TID) | ORAL | 0 refills | Status: DC | PRN

## 2016-07-14 MED ORDER — SODIUM CHLORIDE 0.9 % IV SOLN
Freq: Once | INTRAVENOUS | Status: AC
  Administered 2016-07-14: 11:00:00 via INTRAVENOUS

## 2016-07-14 MED ORDER — ONDANSETRON HCL 4 MG/2ML IJ SOLN
4.0000 mg | Freq: Once | INTRAMUSCULAR | Status: AC
  Administered 2016-07-14: 4 mg via INTRAVENOUS
  Filled 2016-07-14: qty 2

## 2016-07-14 MED ORDER — HYDROCODONE-ACETAMINOPHEN 5-325 MG PO TABS: 2.0000 | ORAL_TABLET | ORAL | 0 refills | Status: DC | PRN

## 2016-07-14 NOTE — ED Triage Notes (Signed)
Pt here with abd pain n/v since yesterday after a cookout.  Denies fever or changes in urination/discharge.

## 2016-07-14 NOTE — ED Notes (Signed)
rn will collect labs at IV start 

## 2016-07-14 NOTE — ED Provider Notes (Signed)
[facility]-EMERGENCY DEPT Provider Note   CSN: [id number] Arrival date & time: 07/14/16  2130     History   Chief Complaint Chief Complaint  Patient presents with  . Abdominal Pain  . Emesis    HPI Amanda Kent is a 21 y.o. female.  The history is provided by the patient. No language interpreter was used.  Abdominal Pain   This is a new problem. The problem occurs constantly. The problem has been gradually improving. The pain is associated with eating. The pain is moderate. Associated symptoms include vomiting. Nothing aggravates the symptoms. Nothing relieves the symptoms.  Emesis   Associated symptoms include abdominal pain.   Pt reports nausea, vomiting and diarrhea.  Pt reports began yesterday Past Medical History:  Diagnosis Date  . Allergy   . Asthma   . Diabetes mellitus without complication [facility])     Patient Active Problem List   Diagnosis Date Noted  . Type 2 diabetes mellitus without complication, without long-term current use of insulin (HCC) 02/07/2015  . BMI 32.0-32.9,adult 02/07/2015    Past Surgical History:  Procedure Laterality Date  . osteomax2    . OSTEOTOMY      OB History    No data available       Home Medications    Prior to Admission medications   Medication Sig Start Date End Date Taking? Authorizing Provider  medroxyPROGESTERone (DEPO-PROVERA) 150 MG/ML injection Inject 1 mL (150 mg total) into the muscle every 3 (three) months. 10/06/15  Yes Denney, Rachelle A, CNM  metFORMIN (GLUCOPHAGE) 1000 MG tablet Take 1 tablet (1,000 mg total) by mouth 2 (two) times daily with a meal. 11/21/15  Yes McVey, Madelaine Bhat, PA-C  HYDROcodone-acetaminophen (NORCO/VICODIN) 5-325 MG tablet Take 2 tablets by mouth every 4 (four) hours as needed. 07/14/16   Elson Areas, PA-C  metFORMIN (GLUCOPHAGE) 500 MG tablet Take 1 tablet (500 mg total) by mouth 2 (two) times daily. Patient not taking: Reported on 01/02/2016 11/09/15   Wallis Bamberg, PA-C    ondansetron (ZOFRAN ODT) 4 MG disintegrating tablet Take 1 tablet (4 mg total) by mouth every 8 (eight) hours as needed for nausea or vomiting. 07/14/16   Elson Areas, PA-C  ondansetron (ZOFRAN ODT) 4 MG disintegrating tablet Take 1 tablet (4 mg total) by mouth every 8 (eight) hours as needed for nausea or vomiting. 07/14/16   Elson Areas, PA-C    Family History Family History  Problem Relation Age of Onset  . Diabetes Mother   . Heart disease Mother   . Hypertension Mother     Social History Social History  Substance Use Topics  . Smoking status: Never Smoker  . Smokeless tobacco: Never Used  . Alcohol use No     Allergies   Patient has no known allergies.   Review of Systems Review of Systems  Gastrointestinal: Positive for abdominal pain and vomiting.  All other systems reviewed and are negative.    Physical Exam Updated Vital Signs BP 106/76 (BP Location: Right Arm)   Pulse 70   Temp 98.8 F (37.1 C) (Oral)   Resp 18   SpO2 100%   Physical Exam  Constitutional: She appears well-developed and well-nourished. No distress.  HENT:  Head: Normocephalic and atraumatic.  Eyes: Conjunctivae are normal.  Neck: Neck supple.  Cardiovascular: Normal rate and regular rhythm.   No murmur heard. Pulmonary/Chest: Effort normal and breath sounds normal. No respiratory distress.  Abdominal: Soft. There is no tenderness.  Musculoskeletal: She exhibits no edema.  Neurological: She is alert.  Skin: Skin is warm and dry.  Psychiatric: She has a normal mood and affect.  Nursing note and vitals reviewed.    ED Treatments / Results  Labs (all labs ordered are listed, but only abnormal results are displayed) Labs Reviewed  COMPREHENSIVE METABOLIC PANEL - Abnormal; Notable for the following:       Result Value   Glucose, Bld 224 (*)    AST 14 (*)    ALT 11 (*)    All other components within normal limits  LIPASE, BLOOD  CBC  URINALYSIS, ROUTINE W REFLEX  MICROSCOPIC  I-STAT BETA HCG BLOOD, ED ([facility], [facility], [facility] ONLY)    EKG  EKG Interpretation None       Radiology No results found.  Procedures Procedures (including critical care time)  Medications Ordered in ED Medications  0.9 %  sodium chloride infusion ( Intravenous Stopped 07/14/16 1200)  ondansetron (ZOFRAN) injection 4 mg (4 mg Intravenous Given 07/14/16 1057)     Initial Impression / Assessment and Plan / ED Course  I have reviewed the triage vital signs and the nursing notes.  Pertinent labs & imaging results that were available during my care of the patient were reviewed by me and considered in my medical decision making (see chart for details).     Pt feels better after Iv fluids and zofran.   Pt counseled on results and follow up  Final Clinical Impressions(s) / ED Diagnoses   Final diagnoses:  Nausea vomiting and diarrhea    New Prescriptions Discharge Medication List as of 07/14/2016  1:47 PM    START taking these medications   Details  HYDROcodone-acetaminophen (NORCO/VICODIN) 5-325 MG tablet Take 2 tablets by mouth every 4 (four) hours as needed., Starting Sun 07/14/2016, Print    !! ondansetron (ZOFRAN ODT) 4 MG disintegrating tablet Take 1 tablet (4 mg total) by mouth every 8 (eight) hours as needed for nausea or vomiting., Starting Sun 07/14/2016, Print    !! ondansetron (ZOFRAN ODT) 4 MG disintegrating tablet Take 1 tablet (4 mg total) by mouth every 8 (eight) hours as needed for nausea or vomiting., Starting Sun 07/14/2016, Print     !! - Potential duplicate medications found. Please discuss with provider.    An After Visit Summary was printed and given to the patient.   Elson AreasSofia, Kynzley Dowson K, [place]PA-C 07/14/16 1630    Charlynne PanderYao, David Hsienta, MD 07/15/16 [phone]

## 2016-07-14 NOTE — Discharge Instructions (Addendum)
See your Physician for recheck in 2 days if symptoms persist

## 2016-08-26 ENCOUNTER — Other Ambulatory Visit: Payer: Self-pay | Admitting: Certified Nurse Midwife

## 2016-08-26 DIAGNOSIS — Z3042 Encounter for surveillance of injectable contraceptive: Secondary | ICD-10-CM

## 2016-08-26 DIAGNOSIS — Z01419 Encounter for gynecological examination (general) (routine) without abnormal findings: Secondary | ICD-10-CM

## 2016-08-27 ENCOUNTER — Ambulatory Visit: Payer: BLUE CROSS/BLUE SHIELD

## 2016-08-28 ENCOUNTER — Other Ambulatory Visit: Payer: Self-pay | Admitting: Obstetrics

## 2016-08-28 ENCOUNTER — Other Ambulatory Visit: Payer: Self-pay

## 2016-08-28 ENCOUNTER — Ambulatory Visit (INDEPENDENT_AMBULATORY_CARE_PROVIDER_SITE_OTHER): Payer: Medicaid Other

## 2016-08-28 DIAGNOSIS — Z3042 Encounter for surveillance of injectable contraceptive: Secondary | ICD-10-CM

## 2016-08-28 DIAGNOSIS — Z01419 Encounter for gynecological examination (general) (routine) without abnormal findings: Secondary | ICD-10-CM

## 2016-08-28 MED ORDER — MEDROXYPROGESTERONE ACETATE 150 MG/ML IM SUSP
150.0000 mg | Freq: Once | INTRAMUSCULAR | Status: AC
  Administered 2016-08-28: 150 mg via INTRAMUSCULAR

## 2016-08-28 MED ORDER — MEDROXYPROGESTERONE ACETATE 150 MG/ML IM SUSP: 150.0000 mg | INTRAMUSCULAR | 0 refills | Status: DC

## 2016-08-28 NOTE — Progress Notes (Signed)
Nurse visit for pt supply Depo given R del w/o difficulty. Next Depo due 11/7-11/21 pt agrees. Pt needs annual Sept 29, 2018 and agrees to schedule during checkout.

## 2016-08-28 NOTE — Progress Notes (Signed)
1 Depo refill given, patient to schedule well woman visit.

## 2016-10-07 ENCOUNTER — Ambulatory Visit: Payer: BLUE CROSS/BLUE SHIELD | Admitting: Certified Nurse Midwife

## 2016-10-17 ENCOUNTER — Ambulatory Visit: Payer: Medicaid Other | Admitting: Certified Nurse Midwife

## 2016-11-01 ENCOUNTER — Encounter (HOSPITAL_COMMUNITY): Payer: Self-pay | Admitting: *Deleted

## 2016-11-01 ENCOUNTER — Emergency Department (HOSPITAL_COMMUNITY)
Admission: EM | Admit: 2016-11-01 | Discharge: 2016-11-01 | Disposition: A | Discharge status: Home / Self Care | Payer: Medicaid Other | Attending: Emergency Medicine | Admitting: Emergency Medicine

## 2016-11-01 DIAGNOSIS — H6091 Unspecified otitis externa, right ear: Secondary | ICD-10-CM | POA: Insufficient documentation

## 2016-11-01 DIAGNOSIS — H60501 Unspecified acute noninfective otitis externa, right ear: Secondary | ICD-10-CM

## 2016-11-01 DIAGNOSIS — Z79899 Other long term (current) drug therapy: Secondary | ICD-10-CM | POA: Insufficient documentation

## 2016-11-01 MED ORDER — NEOMYCIN-POLYMYXIN-HC 3.5-10000-1 OT SUSP: 4.0000 [drp] | Freq: Four times a day (QID) | OTIC | 0 refills | Status: AC

## 2016-11-01 MED ORDER — NEOMYCIN-POLYMYXIN-HC 3.5-10000-1 OT SUSP
4.0000 [drp] | Freq: Once | OTIC | Status: AC
  Administered 2016-11-01: 4 [drp] via OTIC
  Filled 2016-11-01: qty 10

## 2016-11-01 NOTE — ED Triage Notes (Signed)
Pt complains of ear pain for the past week. Pt denies injury to ear.

## 2016-11-01 NOTE — Discharge Instructions (Signed)
There is evidence of an infection in the canal of the ear.  Administer 4 drops of the antibiotic 4 times a day for 7 days.  Follow-up with your primary care provider for any further management of this issue.  Return to the ED for worsening symptoms.  Additional pain management: Antiinflammatory medications: Take 600 mg of ibuprofen every 6 hours or 440 mg (over the counter dose) to 500 mg (prescription dose) of naproxen every 12 hours for the next 3 days. After this time, these medications may be used as needed for pain. Take these medications with food to avoid upset stomach. Choose only one of these medications, do not take them together. Tylenol: Should you continue to have additional pain while taking the ibuprofen or naproxen, you may add in tylenol as needed. Your daily total maximum amount of tylenol from all sources should be limited to 4000mg /day for persons without liver problems, or 2000mg /day for those with liver problems.

## 2016-11-01 NOTE — ED Provider Notes (Signed)
[facility]-EMERGENCY DEPT Provider Note   CSN: [id number] Arrival date & time: 11/01/16  1223     History   Chief Complaint Chief Complaint  Patient presents with  . Otalgia    right ear    HPI Amanda Kent is a 21 y.o. female.  HPI   Amanda Kent is a 21 y.o. female, with a history of DM and asthma, presenting to the ED with right ear pain for the last 5 days.  Pain is intermittent, 7/10, nonradiating. Has been emptying garlic gelcaps to the ear as a home remedy.  Has not tried other therapies for her symptoms.  Denies headache, dizziness, fever/chills, nausea/vomiting, facial swelling, ear swelling, or any other complaints.    Past Medical History:  Diagnosis Date  . Allergy   . Asthma   . Diabetes mellitus without complication [facility](HCC)     Patient Active Problem List   Diagnosis Date Noted  . Type 2 diabetes mellitus without complication, without long-term current use of insulin (HCC) 02/07/2015  . BMI 32.0-32.9,adult 02/07/2015    Past Surgical History:  Procedure Laterality Date  . osteomax2    . OSTEOTOMY      OB History    No data available       Home Medications    Prior to Admission medications   Medication Sig Start Date End Date Taking? Authorizing Provider  HYDROcodone-acetaminophen (NORCO/VICODIN) 5-325 MG tablet Take 2 tablets by mouth every 4 (four) hours as needed. 07/14/16   Elson AreasSofia, Leslie K, PA-C  medroxyPROGESTERone (DEPO-PROVERA) 150 MG/ML injection Inject 1 mL (150 mg total) into the muscle 1 day or 1 dose. 08/28/16 08/29/16  Brock BadHarper, Charles A, MD  medroxyPROGESTERone (DEPO-PROVERA) 150 MG/ML injection ADMINISTER 1 ML(150 MG) IN THE MUSCLE EVERY 3 MONTHS 08/29/16   Brock BadHarper, Charles A, MD  metFORMIN (GLUCOPHAGE) 1000 MG tablet Take 1 tablet (1,000 mg total) by mouth 2 (two) times daily with a meal. 11/21/15   McVey, Madelaine BhatElizabeth Whitney, PA-C  metFORMIN (GLUCOPHAGE) 500 MG tablet Take 1 tablet (500 mg total) by mouth 2  (two) times daily. Patient not taking: Reported on 01/02/2016 11/09/15   Wallis BambergMani, Mario, PA-C  neomycin-polymyxin-hydrocortisone (CORTISPORIN) 3.5-10000-1 OTIC suspension Place 4 drops into the right ear 4 (four) times daily. 11/01/16 11/08/16  Latika Kronick C, PA-C  ondansetron (ZOFRAN ODT) 4 MG disintegrating tablet Take 1 tablet (4 mg total) by mouth every 8 (eight) hours as needed for nausea or vomiting. 07/14/16   Elson AreasSofia, Leslie K, PA-C  ondansetron (ZOFRAN ODT) 4 MG disintegrating tablet Take 1 tablet (4 mg total) by mouth every 8 (eight) hours as needed for nausea or vomiting. 07/14/16   Elson AreasSofia, Leslie K, PA-C    Family History Family History  Problem Relation Age of Onset  . Diabetes Mother   . Heart disease Mother   . Hypertension Mother     Social History Social History  Substance Use Topics  . Smoking status: Never Smoker  . Smokeless tobacco: Never Used  . Alcohol use No     Allergies   Patient has no known allergies.   Review of Systems Review of Systems  Constitutional: Negative for fever.  HENT: Positive for ear pain. Negative for ear discharge.   Neurological: Negative for dizziness, light-headedness and headaches.     Physical Exam Updated Vital Signs BP 127/86 (BP Location: Right Arm)   Pulse (!) 103   Temp 98.3 F (36.8 C) (Oral)   Resp 18   SpO2  100%   Physical Exam  Constitutional: She appears well-developed and well-nourished. No distress.  HENT:  Head: Normocephalic and atraumatic.  Right Ear: There is tenderness.  Left Ear: Tympanic membrane, external ear and ear canal normal.  Gelatinous debris found in the ear canal, thought to be the remnants of the garlic gelcaps patient has been emptying into the ear.  Also noted erythematous canal with white debris.  Tender tragus.  Nontender mastoid. No external ear swelling.  No noted facial edema.  Eyes: Conjunctivae are normal.  Neck: Neck supple.  Cardiovascular: Normal rate and regular rhythm.     Pulmonary/Chest: Effort normal.  Neurological: She is alert.  Skin: Skin is warm and dry. She is not diaphoretic. No pallor.  Psychiatric: She has a normal mood and affect. Her behavior is normal.  Nursing note and vitals reviewed.    ED Treatments / Results  Labs (all labs ordered are listed, but only abnormal results are displayed) Labs Reviewed - No data to display  EKG  EKG Interpretation None       Radiology No results found.  Procedures Procedures (including critical care time)  Medications Ordered in ED Medications  neomycin-polymyxin-hydrocortisone (CORTISPORIN) OTIC (EAR) suspension 4 drop (4 drops Right EAR Given 11/01/16 1736)     Initial Impression / Assessment and Plan / ED Course  I have reviewed the triage vital signs and the nursing notes.  Pertinent labs & imaging results that were available during my care of the patient were reviewed by me and considered in my medical decision making (see chart for details).     Patient presents with what appears to be otitis externa.  Doubt malignant otitis externa at this time.  Patient nontoxic-appearing and vital signs do not suggest sepsis.  PCP follow-up as needed.  Resources given. The patient was given instructions for home care as well as return precautions. Patient voices understanding of these instructions, accepts the plan, and is comfortable with discharge.  Final Clinical Impressions(s) / ED Diagnoses   Final diagnoses:  Acute otitis externa of right ear, unspecified type    New Prescriptions Discharge Medication List as of 11/01/2016  5:19 PM    START taking these medications   Details  neomycin-polymyxin-hydrocortisone (CORTISPORIN) 3.5-10000-1 OTIC suspension Place 4 drops into the right ear 4 (four) times daily., Starting Fri 11/01/2016, Until Fri 11/08/2016, Print         Bralynn Donado C, PA-C 11/04/16 1528    Bethann Berkshire, MD 11/04/16 1918

## 2016-11-19 ENCOUNTER — Ambulatory Visit: Payer: Medicaid Other

## 2016-12-17 ENCOUNTER — Other Ambulatory Visit: Payer: Self-pay | Admitting: Obstetrics

## 2016-12-17 DIAGNOSIS — Z01419 Encounter for gynecological examination (general) (routine) without abnormal findings: Secondary | ICD-10-CM

## 2016-12-17 DIAGNOSIS — Z3042 Encounter for surveillance of injectable contraceptive: Secondary | ICD-10-CM

## 2016-12-19 ENCOUNTER — Ambulatory Visit (INDEPENDENT_AMBULATORY_CARE_PROVIDER_SITE_OTHER): Payer: Self-pay

## 2016-12-19 DIAGNOSIS — Z3042 Encounter for surveillance of injectable contraceptive: Secondary | ICD-10-CM

## 2016-12-19 DIAGNOSIS — Z3202 Encounter for pregnancy test, result negative: Secondary | ICD-10-CM

## 2016-12-19 LAB — POCT URINE PREGNANCY: PREG TEST UR: NEGATIVE

## 2016-12-19 NOTE — Progress Notes (Signed)
Pt here for UPT for depo restart. Pt is to come back in 2 weeks for second upt/depo. Pt advised to bring depo with her to that visit. Pt advised to abstain from IC, and if second test is negative depo will be given at next visit. Test today is negative. Pt states that she has not had IC

## 2017-01-02 ENCOUNTER — Ambulatory Visit (INDEPENDENT_AMBULATORY_CARE_PROVIDER_SITE_OTHER): Payer: Medicaid Other

## 2017-01-02 DIAGNOSIS — Z309 Encounter for contraceptive management, unspecified: Secondary | ICD-10-CM

## 2017-01-02 DIAGNOSIS — Z3042 Encounter for surveillance of injectable contraceptive: Secondary | ICD-10-CM

## 2017-01-02 LAB — POCT URINE PREGNANCY: Preg Test, Ur: NEGATIVE

## 2017-01-02 MED ORDER — MEDROXYPROGESTERONE ACETATE 150 MG/ML IM SUSP
150.0000 mg | Freq: Once | INTRAMUSCULAR | Status: AC
  Administered 2017-01-02: 150 mg via INTRAMUSCULAR

## 2017-01-02 NOTE — Progress Notes (Signed)
Nurse visit for Depo restart. 2nd UPT neg. Pt denies unprotected IC. Pt supplied Depo given L Del w/o complaint. Next Depo due March Mar 15-29 pt agrees

## 2017-02-18 ENCOUNTER — Other Ambulatory Visit: Payer: Self-pay | Admitting: Physician Assistant

## 2017-02-18 DIAGNOSIS — E119 Type 2 diabetes mellitus without complications: Secondary | ICD-10-CM

## 2017-02-19 NOTE — Telephone Encounter (Signed)
Metformin refill Last OV: 11/21/15 Last Refill:11/21/15 Pharmacy:

## 2017-03-24 ENCOUNTER — Other Ambulatory Visit (HOSPITAL_COMMUNITY)
Admission: RE | Admit: 2017-03-24 | Discharge: 2017-03-24 | Disposition: A | Discharge status: Home / Self Care | Payer: BC Managed Care – PPO | Source: Ambulatory Visit | Attending: Family Medicine | Admitting: Family Medicine

## 2017-03-24 ENCOUNTER — Ambulatory Visit: Payer: Medicaid Other

## 2017-03-24 ENCOUNTER — Encounter: Payer: Self-pay | Admitting: Family Medicine

## 2017-03-24 ENCOUNTER — Ambulatory Visit (INDEPENDENT_AMBULATORY_CARE_PROVIDER_SITE_OTHER): Payer: BC Managed Care – PPO | Admitting: Family Medicine

## 2017-03-24 VITALS — BP 124/84 | HR 105 | Ht 65.0 in | Wt 198.8 lb

## 2017-03-24 DIAGNOSIS — N870 Mild cervical dysplasia: Secondary | ICD-10-CM | POA: Diagnosis not present

## 2017-03-24 DIAGNOSIS — Z113 Encounter for screening for infections with a predominantly sexual mode of transmission: Secondary | ICD-10-CM | POA: Insufficient documentation

## 2017-03-24 DIAGNOSIS — Z309 Encounter for contraceptive management, unspecified: Secondary | ICD-10-CM | POA: Diagnosis not present

## 2017-03-24 DIAGNOSIS — Z01419 Encounter for gynecological examination (general) (routine) without abnormal findings: Secondary | ICD-10-CM | POA: Insufficient documentation

## 2017-03-24 DIAGNOSIS — Z3042 Encounter for surveillance of injectable contraceptive: Secondary | ICD-10-CM

## 2017-03-24 DIAGNOSIS — Z124 Encounter for screening for malignant neoplasm of cervix: Secondary | ICD-10-CM

## 2017-03-24 MED ORDER — MEDROXYPROGESTERONE ACETATE 150 MG/ML IM SUSP
150.0000 mg | INTRAMUSCULAR | Status: DC
  Administered 2017-03-24 – 2017-09-11 (×3): 150 mg via INTRAMUSCULAR

## 2017-03-24 NOTE — Patient Instructions (Signed)
Preventive Care 18-39 Years, Female Preventive care refers to lifestyle choices and visits with your health care provider that can promote health and wellness. What does preventive care include?  A yearly physical exam. This is also called an annual well check.  Dental exams once or twice a year.  Routine eye exams. Ask your health care provider how often you should have your eyes checked.  Personal lifestyle choices, including: ? Daily care of your teeth and gums. ? Regular physical activity. ? Eating a healthy diet. ? Avoiding tobacco and drug use. ? Limiting alcohol use. ? Practicing safe sex. ? Taking vitamin and mineral supplements as recommended by your health care provider. What happens during an annual well check? The services and screenings done by your health care provider during your annual well check will depend on your age, overall health, lifestyle risk factors, and family history of disease. Counseling Your health care provider may ask you questions about your:  Alcohol use.  Tobacco use.  Drug use.  Emotional well-being.  Home and relationship well-being.  Sexual activity.  Eating habits.  Work and work Statistician.  Method of birth control.  Menstrual cycle.  Pregnancy history.  Screening You may have the following tests or measurements:  Height, weight, and BMI.  Diabetes screening. This is done by checking your blood sugar (glucose) after you have not eaten for a while (fasting).  Blood pressure.  Lipid and cholesterol levels. These may be checked every 5 years starting at age 38.  Skin check.  Hepatitis C blood test.  Hepatitis B blood test.  Sexually transmitted disease (STD) testing.  BRCA-related cancer screening. This may be done if you have a family history of breast, ovarian, tubal, or peritoneal cancers.  Pelvic exam and Pap test. This may be done every 3 years starting at age 38. Starting at age 30, this may be done  every 5 years if you have a Pap test in combination with an HPV test.  Discuss your test results, treatment options, and if necessary, the need for more tests with your health care provider. Vaccines Your health care provider may recommend certain vaccines, such as:  Influenza vaccine. This is recommended every year.  Tetanus, diphtheria, and acellular pertussis (Tdap, Td) vaccine. You may need a Td booster every 10 years.  Varicella vaccine. You may need this if you have not been vaccinated.  HPV vaccine. If you are 39 or younger, you may need three doses over 6 months.  Measles, mumps, and rubella (MMR) vaccine. You may need at least one dose of MMR. You may also need a second dose.  Pneumococcal 13-valent conjugate (PCV13) vaccine. You may need this if you have certain conditions and were not previously vaccinated.  Pneumococcal polysaccharide (PPSV23) vaccine. You may need one or two doses if you smoke cigarettes or if you have certain conditions.  Meningococcal vaccine. One dose is recommended if you are age 68-21 years and a first-year college student living in a residence hall, or if you have one of several medical conditions. You may also need additional booster doses.  Hepatitis A vaccine. You may need this if you have certain conditions or if you travel or work in places where you may be exposed to hepatitis A.  Hepatitis B vaccine. You may need this if you have certain conditions or if you travel or work in places where you may be exposed to hepatitis B.  Haemophilus influenzae type b (Hib) vaccine. You may need this  if you have certain risk factors.  Talk to your health care provider about which screenings and vaccines you need and how often you need them. This information is not intended to replace advice given to you by your health care provider. Make sure you discuss any questions you have with your health care provider. Document Released: 02/19/2001 Document Revised:  09/13/2015 Document Reviewed: 10/25/2014 Elsevier Interactive Patient Education  2018 Elsevier Inc.  

## 2017-03-24 NOTE — Progress Notes (Signed)
Subjective:     Amanda Kent is a 22 y.o. female and is here for a comprehensive physical exam. The patient reports problems - thinks she has a yeast infection, tried OTC meds, but unsure. .  Social History   Socioeconomic History  . Marital status: Single    Spouse name: Not on file  . Number of children: Not on file  . Years of education: Not on file  . Highest education level: Not on file  Social Needs  . Financial resource strain: Not on file  . Food insecurity - worry: Not on file  . Food insecurity - inability: Not on file  . Transportation needs - medical: Not on file  . Transportation needs - non-medical: Not on file  Occupational History  . Not on file  Tobacco Use  . Smoking status: Never Smoker  . Smokeless tobacco: Never Used  Substance and Sexual Activity  . Alcohol use: No    Alcohol/week: 0.0 oz  . Drug use: No  . Sexual activity: Yes    Birth control/protection: Injection  Other Topics Concern  . Not on file  Social History Narrative  . Not on file   Health Maintenance  Topic Date Due  . PNEUMOCOCCAL POLYSACCHARIDE VACCINE (1) 02/26/1996  . FOOT EXAM  02/26/2004  . OPHTHALMOLOGY EXAM  02/26/2004  . HEMOGLOBIN A1C  05/20/2016  . INFLUENZA VACCINE  08/07/2016  . URINE MICROALBUMIN  11/20/2016  . CHLAMYDIA SCREENING  06/04/2017  . PAP SMEAR  10/06/2018  . TETANUS/TDAP  10/16/2021  . HIV Screening  Completed    The following portions of the patient's history were reviewed and updated as appropriate: allergies, current medications, past family history, past medical history, past social history, past surgical history and problem list.  Review of Systems Pertinent items noted in HPI and remainder of comprehensive ROS otherwise negative.   Objective:    BP 124/84   Pulse (!) 105   Ht 5\' 5"  (1.651 m)   Wt 198 lb 12.8 oz (90.2 kg)   BMI 33.08 kg/m  General appearance: alert, cooperative and appears stated age Head: Normocephalic, without  obvious abnormality, atraumatic Neck: no adenopathy, supple, symmetrical, trachea midline and thyroid not enlarged, symmetric, no tenderness/mass/nodules Lungs: clear to auscultation bilaterally Breasts: normal appearance, no masses or tenderness, bilateral nipple peircings Heart: regular rate and rhythm, S1, S2 normal, no murmur, click, rub or gallop Abdomen: soft, non-tender; bowel sounds normal; no masses,  no organomegaly Pelvic: cervix normal in appearance, external genitalia normal, no adnexal masses or tenderness, no cervical motion tenderness, uterus normal size, shape, and consistency and vagina normal without discharge Extremities: extremities normal, atraumatic, no cyanosis or edema Pulses: 2+ and symmetric Skin: Skin color, texture, turgor normal. No rashes or lesions Lymph nodes: Cervical, supraclavicular, and axillary nodes normal. Neurologic: Grossly normal    Assessment:    Healthy female exam.      Plan:      Problem List Items Addressed This Visit    None    Visit Diagnoses    Screen for STD (sexually transmitted disease)    -  Primary   Relevant Orders   RPR   Hepatitis B surface antigen   Hepatitis C antibody   HIV antibody   Cytology - PAP   Screening for malignant neoplasm of cervix       Encounter for gynecological examination without abnormal finding       Relevant Orders   Cytology - PAP   Encounter  for surveillance of injectable contraceptive       Relevant Medications   medroxyPROGESTERone (DEPO-PROVERA) injection 150 mg    Return in 1 year (on 03/25/2018).   See After Visit Summary for Counseling Recommendations

## 2017-03-24 NOTE — Progress Notes (Signed)
Pt is in the office for annual, last pap 10-06-15. Pt is currently on depo. Pt complains of slight vaginal itching, states she has been taking OTC pills for yeast. Administered depo and pt tolerated well .Marland Kitchen. Administrations This Visit    medroxyPROGESTERone (DEPO-PROVERA) injection 150 mg    Admin Date 03/24/2017 Action Given Dose 150 mg Route Intramuscular Administered By Katrina StackStalling, Destiny Trickey D, RN

## 2017-03-25 LAB — RPR: RPR Ser Ql: NONREACTIVE

## 2017-03-25 LAB — HEPATITIS C ANTIBODY

## 2017-03-25 LAB — HEPATITIS B SURFACE ANTIGEN: HEP B S AG: NEGATIVE

## 2017-03-25 LAB — HIV ANTIBODY (ROUTINE TESTING W REFLEX): HIV Screen 4th Generation wRfx: NONREACTIVE

## 2017-03-26 ENCOUNTER — Encounter: Payer: Self-pay | Admitting: Family Medicine

## 2017-03-26 DIAGNOSIS — R87612 Low grade squamous intraepithelial lesion on cytologic smear of cervix (LGSIL): Secondary | ICD-10-CM | POA: Insufficient documentation

## 2017-03-26 LAB — CYTOLOGY - PAP
Chlamydia: NEGATIVE
HPV: DETECTED — AB
Neisseria Gonorrhea: NEGATIVE

## 2017-03-27 ENCOUNTER — Telehealth: Payer: Self-pay

## 2017-03-27 NOTE — Telephone Encounter (Signed)
TC to pt regarding message /results Pt not ava left detailed message for pt to call back.

## 2017-03-29 ENCOUNTER — Other Ambulatory Visit: Payer: Self-pay | Admitting: Physician Assistant

## 2017-03-29 DIAGNOSIS — E119 Type 2 diabetes mellitus without complications: Secondary | ICD-10-CM

## 2017-03-29 NOTE — Telephone Encounter (Signed)
Called and tried to schedule pt for a F/U for additional refills. Made pt aware of RX refill but would need an OV for additional refills.   When pt calls back, please schedule for an OV.  Thanks!

## 2017-04-10 ENCOUNTER — Encounter (HOSPITAL_COMMUNITY): Payer: Self-pay | Admitting: Emergency Medicine

## 2017-04-10 ENCOUNTER — Emergency Department (HOSPITAL_COMMUNITY)
Admission: EM | Admit: 2017-04-10 | Discharge: 2017-04-10 | Disposition: A | Discharge status: Home / Self Care | Payer: BC Managed Care – PPO | Attending: Emergency Medicine | Admitting: Emergency Medicine

## 2017-04-10 ENCOUNTER — Other Ambulatory Visit: Payer: Self-pay

## 2017-04-10 DIAGNOSIS — J45909 Unspecified asthma, uncomplicated: Secondary | ICD-10-CM | POA: Diagnosis not present

## 2017-04-10 DIAGNOSIS — Z7984 Long term (current) use of oral hypoglycemic drugs: Secondary | ICD-10-CM | POA: Diagnosis not present

## 2017-04-10 DIAGNOSIS — E119 Type 2 diabetes mellitus without complications: Secondary | ICD-10-CM | POA: Insufficient documentation

## 2017-04-10 DIAGNOSIS — R05 Cough: Secondary | ICD-10-CM | POA: Diagnosis present

## 2017-04-10 DIAGNOSIS — Z79899 Other long term (current) drug therapy: Secondary | ICD-10-CM | POA: Diagnosis not present

## 2017-04-10 DIAGNOSIS — J069 Acute upper respiratory infection, unspecified: Secondary | ICD-10-CM | POA: Insufficient documentation

## 2017-04-10 MED ORDER — BENZONATATE 100 MG PO CAPS: 200.0000 mg | ORAL_CAPSULE | Freq: Two times a day (BID) | ORAL | 0 refills | Status: DC | PRN

## 2017-04-10 NOTE — ED Triage Notes (Addendum)
Pt arriving with complaint of emesis and productive cough hat has been present x2 weeks. Pt states that she has had only 1 episode of coughing around 1am which produced blood. Pt reports the blood was bright red in color.

## 2017-04-10 NOTE — ED Notes (Signed)
Patient states she has had a cough for 2 weeks. At about 1 am she had some ice cream which induced a coughing fit that led to her throwing up. Patient states there was a small amount of blood present when she threw up that she thinks may be from all the coughing.

## 2017-04-10 NOTE — Discharge Instructions (Signed)
Try taking Zyrtec or Claritin.

## 2017-04-10 NOTE — ED Provider Notes (Signed)
[facility]-EMERGENCY DEPT Provider Note   CSN: [id number] Arrival date & time: 04/10/17  0141     History   Chief Complaint Chief Complaint  Patient presents with  . Emesis    HPI Amanda Kent is a 22 y.o. female.  Patient presents to the ED with a chief complaint of cough.  She states that she has had a cough for the past weeks.  States that today when she ate some ice cream it caused her to have a cough spell with post-tussive emesis.  She reports seeing some streaky blood in the vomit.  She denies any pain, but states that the cough is aggravating and she would like something for this. She denies any fever or sputum.  Denies any other associated symptoms.  The history is provided by the patient. No language interpreter was used.    Past Medical History:  Diagnosis Date  . Allergy   . Asthma   . Diabetes mellitus without complication [facility])     Patient Active Problem List   Diagnosis Date Noted  . LGSIL on Pap smear of cervix 03/26/2017  . Type 2 diabetes mellitus without complication, without long-term current use of insulin (HCC) 02/07/2015  . BMI 32.0-32.9,adult 02/07/2015    Past Surgical History:  Procedure Laterality Date  . osteomax2    . OSTEOTOMY       OB History    Gravida  0   Para  0   Term  0   Preterm  0   AB  0   Living  0     SAB  0   TAB  0   Ectopic  0   Multiple  0   Live Births  0            Home Medications    Prior to Admission medications   Medication Sig Start Date End Date Taking? Authorizing Provider  medroxyPROGESTERone (DEPO-PROVERA) 150 MG/ML injection ADMINISTER 1 ML(150 MG) IN THE MUSCLE EVERY 3 MONTHS 12/17/16   Brock Bad, MD  metFORMIN (GLUCOPHAGE) 1000 MG tablet TAKE 1 TABLET BY MOUTH TWICE DAILY WITH A MEAL 03/29/17   McVey, Madelaine Bhat, PA-C    Family History Family History  Problem Relation Age of Onset  . Diabetes Mother   . Heart disease Mother   .  Hypertension Mother     Social History Social History   Tobacco Use  . Smoking status: Never Smoker  . Smokeless tobacco: Never Used  Substance Use Topics  . Alcohol use: No    Alcohol/week: 0.0 oz  . Drug use: No     Allergies   Patient has no known allergies.   Review of Systems Review of Systems  All other systems reviewed and are negative.    Physical Exam Updated Vital Signs BP 130/83 (BP Location: Right Arm)   Pulse 84   Temp 98.6 F (37 C) (Oral)   Resp 18   SpO2 98%   Physical Exam Physical Exam  Constitutional: Pt  is oriented to person, place, and time. Appears well-developed and well-nourished. No distress.  HENT:  Head: Normocephalic and atraumatic.  Right Ear: Tympanic membrane, external ear and ear canal normal.  Left Ear: Tympanic membrane, external ear and ear canal normal.  Nose: Mucosal edema and moderate rhinorrhea present. No epistaxis. Right sinus exhibits no maxillary sinus tenderness and no frontal sinus tenderness. Left sinus exhibits no maxillary sinus tenderness and no frontal sinus tenderness.  Mouth/Throat:  Uvula is midline and mucous membranes are normal. Mucous membranes are not pale and not cyanotic. No oropharyngeal exudate, posterior oropharyngeal edema, posterior oropharyngeal erythema or tonsillar abscesses.  Eyes: Conjunctivae are normal. Pupils are equal, round, and reactive to light.  Neck: Normal range of motion and full passive range of motion without pain.  Cardiovascular: Normal rate and intact distal pulses.   Pulmonary/Chest: Effort normal and breath sounds normal. No stridor.  Clear and equal breath sounds without focal wheezes, rhonchi, rales  Abdominal: Soft. Bowel sounds are normal. There is no tenderness.  Musculoskeletal: Normal range of motion.  Lymphadenopathy:    Pthas no cervical adenopathy.  Neurological: Pt is alert and oriented to person, place, and time.  Skin: Skin is warm and dry. No rash noted. Pt is  not diaphoretic.  Psychiatric: Normal mood and affect.  Nursing note and vitals reviewed.     ED Treatments / Results  Labs (all labs ordered are listed, but only abnormal results are displayed) Labs Reviewed - No data to display  EKG None  Radiology No results found.  Procedures Procedures (including critical care time)  Medications Ordered in ED Medications - No data to display   Initial Impression / Assessment and Plan / ED Course  I have reviewed the triage vital signs and the nursing notes.  Pertinent labs & imaging results that were available during my care of the patient were reviewed by me and considered in my medical decision making (see chart for details).     Patients symptoms are consistent with URI, likely viral etiology. Discussed that antibiotics are not indicated for viral infections. Pt will be discharged with symptomatic treatment.  Verbalizes understanding and is agreeable with plan. Pt is hemodynamically stable & in NAD prior to dc.   Final Clinical Impressions(s) / ED Diagnoses   Final diagnoses:  Upper respiratory tract infection, unspecified type    ED Discharge Orders        Ordered    benzonatate (TESSALON) 100 MG capsule  2 times daily PRN     04/10/17 0459       Roxy HorsemanBrowning, Aysa Larivee, PA-C 04/10/17 0500    Dione BoozeGlick, David, MD 04/10/17 [phone]

## 2017-04-11 ENCOUNTER — Telehealth: Payer: Self-pay | Admitting: *Deleted

## 2017-04-11 NOTE — Telephone Encounter (Signed)
Pt called to office with questions regarding pap smear. Results reviewed and advised pt to f/u in 1 year for repeat testing.

## 2017-06-11 ENCOUNTER — Other Ambulatory Visit: Payer: Self-pay | Admitting: Obstetrics

## 2017-06-11 DIAGNOSIS — Z01419 Encounter for gynecological examination (general) (routine) without abnormal findings: Secondary | ICD-10-CM

## 2017-06-11 DIAGNOSIS — Z3042 Encounter for surveillance of injectable contraceptive: Secondary | ICD-10-CM

## 2017-06-12 ENCOUNTER — Other Ambulatory Visit: Payer: Self-pay | Admitting: *Deleted

## 2017-06-12 ENCOUNTER — Other Ambulatory Visit: Payer: Self-pay | Admitting: Obstetrics

## 2017-06-12 DIAGNOSIS — Z01419 Encounter for gynecological examination (general) (routine) without abnormal findings: Secondary | ICD-10-CM

## 2017-06-12 DIAGNOSIS — Z3042 Encounter for surveillance of injectable contraceptive: Secondary | ICD-10-CM

## 2017-06-12 MED ORDER — MEDROXYPROGESTERONE ACETATE 150 MG/ML IM SUSP: INTRAMUSCULAR | 3 refills | Status: DC

## 2017-06-12 NOTE — Progress Notes (Signed)
Pt called to office stating she is due for depo and rx not at pharmacy. Rx was sent per protocol. Pt was just seen in office March 2019 for AEX.

## 2017-06-13 ENCOUNTER — Ambulatory Visit (INDEPENDENT_AMBULATORY_CARE_PROVIDER_SITE_OTHER): Payer: BC Managed Care – PPO | Admitting: Obstetrics

## 2017-06-13 VITALS — BP 133/92 | HR 92 | Ht 65.0 in | Wt 191.4 lb

## 2017-06-13 DIAGNOSIS — Z3042 Encounter for surveillance of injectable contraceptive: Secondary | ICD-10-CM | POA: Diagnosis not present

## 2017-06-13 NOTE — Progress Notes (Signed)
Depo Provera 150 mg IM given.

## 2017-06-16 ENCOUNTER — Ambulatory Visit: Payer: BC Managed Care – PPO

## 2017-09-02 ENCOUNTER — Ambulatory Visit: Payer: Medicaid Other

## 2017-09-04 ENCOUNTER — Ambulatory Visit: Payer: Medicaid Other

## 2017-09-11 ENCOUNTER — Ambulatory Visit (INDEPENDENT_AMBULATORY_CARE_PROVIDER_SITE_OTHER): Payer: BLUE CROSS/BLUE SHIELD | Admitting: *Deleted

## 2017-09-11 VITALS — BP 133/84 | HR 102 | Wt 196.0 lb

## 2017-09-11 DIAGNOSIS — Z3042 Encounter for surveillance of injectable contraceptive: Secondary | ICD-10-CM | POA: Diagnosis not present

## 2017-09-11 NOTE — Progress Notes (Signed)
Pt is in office for depo injection.  Pt is on time for Depo. Pt supplied depo for today's injection. Pt tolerated injection well. Pt has no other concerns today.  BP 133/84   Pulse (!) 102   Wt 196 lb (88.9 kg)   BMI 32.62 kg/m   Administrations This Visit    medroxyPROGESTERone (DEPO-PROVERA) injection 150 mg    Admin Date 09/11/2017 Action Given Dose 150 mg Route Intramuscular Administered By Lanney Gins, CMA

## 2017-11-24 ENCOUNTER — Other Ambulatory Visit: Payer: Self-pay | Admitting: Emergency Medicine

## 2017-11-24 DIAGNOSIS — E119 Type 2 diabetes mellitus without complications: Secondary | ICD-10-CM

## 2017-11-24 NOTE — Telephone Encounter (Signed)
Copied from CRM [phone]#[id number]. Topic: Quick Communication - Rx Refill/Question >> Nov 24, 2017  1:02 PM Percival SpanishKennedy, Cheryl W wrote: Medication   metFORMIN (GLUCOPHAGE) 1000 MG tablet     pt is out of this med and is asking if enough meds can be sent in till her next appt in Dec   Has the patient contacted their pharmacy  yes   Preferred Pharmacy  [facility] [address] [place]  Agent: Please be advised that RX refills may take up to 3 business days. We ask that you follow-up with your pharmacy.

## 2017-11-24 NOTE — Telephone Encounter (Signed)
Copied from CRM [id number]. Topic: Quick Communication - Rx Refill/Question >> Nov 24, 2017 12:58 PM Percival SpanishKennedy, Cheryl W wrote: Medication    metFORMIN (GLUCOPHAGE) 1000 MG tablet      pt made an appt and is asking if a few pills can be called in until her visit  in Dec   has the patient contacted their pharmacy yes   (Preferred Pharmacy  [facility] [address] [place]   Agent: Please be advised that RX refills may take up to 3 business days. We ask that you follow-up with your pharmacy.

## 2017-11-25 ENCOUNTER — Other Ambulatory Visit: Payer: Self-pay | Admitting: Physician Assistant

## 2017-11-25 DIAGNOSIS — E119 Type 2 diabetes mellitus without complications: Secondary | ICD-10-CM

## 2017-11-25 NOTE — Telephone Encounter (Signed)
Requested medication (s) are due for refill today: yes  Requested medication (s) are on the active medication list: yes  Last refill:  03/2017  Future visit scheduled: yes on 12/27/17  Notes to clinic:  Unable to fill per protocol. Pt asking if she could have enough medication until appt on 12/27/17. Last office visit with [place] on 11/21/15    Requested Prescriptions  Pending Prescriptions Disp Refills   metFORMIN (GLUCOPHAGE) 1000 MG tablet 30 tablet 0     Endocrinology:  Diabetes - Biguanides Failed - 11/25/2017  7:59 AM      Failed - Cr in normal range and within 360 days    Creat  Date Value Ref Range Status  11/21/2015 0.75 0.50 - 1.10 mg/dL Final   Creatinine, Ser  Date Value Ref Range Status  07/14/2016 0.70 0.44 - 1.00 mg/dL Final         Failed - HBA1C is between 0 and 7.9 and within 180 days    Hemoglobin A1C  Date Value Ref Range Status  11/21/2015 7.1  Final         Failed - eGFR in normal range and within 360 days    GFR, Est African American  Date Value Ref Range Status  11/21/2015 >89 >=60 mL/min Final   GFR calc Af Amer  Date Value Ref Range Status  07/14/2016 >60 >60 mL/min Final    Comment:    (NOTE) The eGFR has been calculated using the CKD EPI equation. This calculation has not been validated in all clinical situations. eGFR's persistently <60 mL/min signify possible Chronic Kidney Disease.    GFR, Est Non African American  Date Value Ref Range Status  11/21/2015 >89 >=60 mL/min Final   GFR calc non Af Amer  Date Value Ref Range Status  07/14/2016 >60 >60 mL/min Final         Failed - Valid encounter within last 6 months    Recent Outpatient Visits          2 years ago Type 2 diabetes mellitus without complication, without long-term current use of insulin ([place])   Primary Care at [facility], Gelene Mink, PA-C   2 years ago Type 2 diabetes mellitus without complication, without long-term current use of insulin [facility])   Primary Care at [facility], Linton Ham, MD   3 years ago Diabetes type 2, controlled   [facility] at [facility], Ranell Patrick, MD   3 years ago Type 1 diabetes mellitus with complications   [facility] at [facility], MD   4 years ago Type 2 diabetes mellitus without complication   [facility] at [facility], Ranell Patrick, MD      Future Appointments            In 3 weeks Sagardia, Ines Bloomer, MD Primary Care at [place], [facility]

## 2017-11-26 ENCOUNTER — Other Ambulatory Visit: Payer: Self-pay | Admitting: *Deleted

## 2017-11-26 DIAGNOSIS — Z01419 Encounter for gynecological examination (general) (routine) without abnormal findings: Secondary | ICD-10-CM

## 2017-11-26 DIAGNOSIS — Z3042 Encounter for surveillance of injectable contraceptive: Secondary | ICD-10-CM

## 2017-11-26 MED ORDER — MEDROXYPROGESTERONE ACETATE 150 MG/ML IM SUSP: INTRAMUSCULAR | 3 refills | Status: DC

## 2017-11-26 NOTE — Progress Notes (Signed)
New order for Depo sent per pharmacy need.

## 2017-11-27 ENCOUNTER — Ambulatory Visit: Payer: BLUE CROSS/BLUE SHIELD

## 2017-12-11 ENCOUNTER — Ambulatory Visit (INDEPENDENT_AMBULATORY_CARE_PROVIDER_SITE_OTHER): Payer: BLUE CROSS/BLUE SHIELD

## 2017-12-11 DIAGNOSIS — Z3042 Encounter for surveillance of injectable contraceptive: Secondary | ICD-10-CM

## 2017-12-11 NOTE — Progress Notes (Signed)
Nurse visit for Depo. Pt is within her window. Depo given L Del w/o complaints. Pt aware repeat Depo due in 3 mos.

## 2017-12-11 NOTE — Progress Notes (Signed)
I have reviewed the chart and agree with nursing staff's documentation of this patient's encounter.  Catalina AntiguaPeggy Janalynn Eder, MD 12/11/2017 11:29 AM

## 2017-12-18 ENCOUNTER — Ambulatory Visit (INDEPENDENT_AMBULATORY_CARE_PROVIDER_SITE_OTHER): Payer: BLUE CROSS/BLUE SHIELD | Admitting: Emergency Medicine

## 2017-12-18 ENCOUNTER — Other Ambulatory Visit: Payer: Self-pay

## 2017-12-18 ENCOUNTER — Encounter: Payer: Self-pay | Admitting: Emergency Medicine

## 2017-12-18 VITALS — BP 128/85 | HR 93 | Temp 99.7°F | Resp 16 | Ht 64.0 in | Wt 190.4 lb

## 2017-12-18 DIAGNOSIS — E1165 Type 2 diabetes mellitus with hyperglycemia: Secondary | ICD-10-CM

## 2017-12-18 LAB — POCT GLYCOSYLATED HEMOGLOBIN (HGB A1C): HEMOGLOBIN A1C: 13.8 % — AB (ref 4.0–5.6)

## 2017-12-18 LAB — GLUCOSE, POCT (MANUAL RESULT ENTRY): POC Glucose: 293 mg/dl — AB (ref 70–99)

## 2017-12-18 MED ORDER — METFORMIN HCL 1000 MG PO TABS: ORAL_TABLET | ORAL | 3 refills | Status: DC

## 2017-12-18 MED ORDER — GLIPIZIDE 5 MG PO TABS: 5.0000 mg | ORAL_TABLET | Freq: Every day | ORAL | 3 refills | Status: DC

## 2017-12-18 NOTE — Assessment & Plan Note (Signed)
Uncontrolled diabetes with hemoglobin A1c at 13.8.  Patient was off metformin for 4 weeks and restarted taking it 4 weeks ago.  Advised to start glipizide 5 mg in the morning.  Diet and exercise encouraged.  Follow-up in 3 months.

## 2017-12-18 NOTE — Progress Notes (Signed)
Lorenzo R Brigham 22 y.o.   Chief Complaint  Patient presents with  . Diabetes    follow up  . Medication Refill    Metformin HCL   Lab Results  Component Value Date   HGBA1C 7.1 11/21/2015   BP Readings from Last 3 Encounters:  12/18/17 128/85  09/11/17 133/84  06/13/17 (!) 133/92   Wt Readings from Last 3 Encounters:  12/18/17 190 lb 6.4 oz (86.4 kg)  09/11/17 196 lb (88.9 kg)  06/13/17 191 lb 6.4 oz (86.8 kg)    HISTORY OF PRESENT ILLNESS: This is a 22 y.o. female with history of diabetes here to establish care with me and follow-up.  Also needs medication refill.  Doing well.  Has no complaints.  Take metformin 1000 mg twice a day. There is a family history for diabetes.  HPI   Prior to Admission medications   Medication Sig Start Date End Date Taking? Authorizing Provider  medroxyPROGESTERone (DEPO-PROVERA) 150 MG/ML injection ADMINISTER 1 ML(150 MG) IN THE MUSCLE EVERY 3 MONTHS 11/26/17  Yes Brock Bad, MD  metFORMIN (GLUCOPHAGE) 1000 MG tablet TAKE 1 TABLET BY MOUTH TWICE DAILY WITH A MEAL 11/26/17  Yes McVey, Madelaine Bhat, PA-C  benzonatate (TESSALON) 100 MG capsule Take 2 capsules (200 mg total) by mouth 2 (two) times daily as needed for cough. Patient not taking: Reported on 12/11/2017 04/10/17   Roxy Horseman, PA-C    No Known Allergies  Patient Active Problem List   Diagnosis Date Noted  . LGSIL on Pap smear of cervix 03/26/2017  . Type 2 diabetes mellitus without complication, without long-term current use of insulin (HCC) 02/07/2015  . BMI 32.0-32.9,adult 02/07/2015    Past Medical History:  Diagnosis Date  . Allergy   . Asthma   . Diabetes mellitus without complication [facility])     Past Surgical History:  Procedure Laterality Date  . osteomax2    . OSTEOTOMY      Social History   Socioeconomic History  . Marital status: Single    Spouse name: Not on file  . Number of children: Not on file  . Years of education: Not on file    . Highest education level: Not on file  Occupational History  . Not on file  Social Needs  . Financial resource strain: Not on file  . Food insecurity:    Worry: Not on file    Inability: Not on file  . Transportation needs:    Medical: Not on file    Non-medical: Not on file  Tobacco Use  . Smoking status: Never Smoker  . Smokeless tobacco: Never Used  Substance and Sexual Activity  . Alcohol use: No    Alcohol/week: 0.0 standard drinks  . Drug use: No  . Sexual activity: Yes    Birth control/protection: Injection  Lifestyle  . Physical activity:    Days per week: Not on file    Minutes per session: Not on file  . Stress: Not on file  Relationships  . Social connections:    Talks on phone: Not on file    Gets together: Not on file    Attends religious service: Not on file    Active member of club or organization: Not on file    Attends meetings of clubs or organizations: Not on file    Relationship status: Not on file  . Intimate partner violence:    Fear of current or ex partner: Not on file    Emotionally abused:  Not on file    Physically abused: Not on file    Forced sexual activity: Not on file  Other Topics Concern  . Not on file  Social History Narrative  . Not on file    Family History  Problem Relation Age of Onset  . Diabetes Mother   . Heart disease Mother   . Hypertension Mother      Review of Systems  Constitutional: Negative.  Negative for chills, fever and weight loss.  HENT: Negative.  Negative for sore throat.   Eyes: Negative.  Negative for blurred vision and double vision.  Respiratory: Negative.  Negative for cough and shortness of breath.   Cardiovascular: Negative.  Negative for chest pain and palpitations.  Gastrointestinal: Negative.  Negative for abdominal pain, diarrhea, nausea and vomiting.  Genitourinary: Negative.  Negative for dysuria and hematuria.  Musculoskeletal: Negative.  Negative for back pain, myalgias and neck pain.   Skin: Negative.  Negative for rash.  Neurological: Negative.  Negative for dizziness and headaches.  Endo/Heme/Allergies: Negative.   All other systems reviewed and are negative.   Vitals:   12/18/17 1050  BP: 128/85  Pulse: 93  Resp: 16  Temp: 99.7 F (37.6 C)  SpO2: 97%   Results for orders placed or performed in visit on 12/18/17 (from the past 24 hour(s))  POCT glucose (manual entry)     Status: Abnormal   Collection Time: 12/18/17 11:57 AM  Result Value Ref Range   POC Glucose 293 (A) 70 - 99 mg/dl  POCT glycosylated hemoglobin (Hb A1C)     Status: Abnormal   Collection Time: 12/18/17 12:01 PM  Result Value Ref Range   Hemoglobin A1C 13.8 (A) 4.0 - 5.6 %   HbA1c POC (<> result, manual entry)     HbA1c, POC (prediabetic range)     HbA1c, POC (controlled diabetic range)      Physical Exam Vitals signs reviewed.  Constitutional:      Appearance: Normal appearance.  HENT:     Head: Normocephalic and atraumatic.     Nose: Nose normal.     Mouth/Throat:     Mouth: Mucous membranes are dry.  Eyes:     Extraocular Movements: Extraocular movements intact.     Conjunctiva/sclera: Conjunctivae normal.     Pupils: Pupils are equal, round, and reactive to light.  Neck:     Musculoskeletal: Normal range of motion and neck supple.  Cardiovascular:     Rate and Rhythm: Normal rate and regular rhythm.     Pulses: Normal pulses.     Heart sounds: Normal heart sounds.  Pulmonary:     Effort: Pulmonary effort is normal.     Breath sounds: Normal breath sounds.  Abdominal:     General: Abdomen is flat. There is no distension.     Palpations: Abdomen is soft.     Tenderness: There is no abdominal tenderness.  Musculoskeletal: Normal range of motion.  Skin:    General: Skin is warm and dry.     Capillary Refill: Capillary refill takes less than 2 seconds.  Neurological:     General: No focal deficit present.     Mental Status: She is alert and oriented to person, place,  and time.     Coordination: Coordination normal.  Psychiatric:        Mood and Affect: Mood normal.        Behavior: Behavior normal.    A total of 40 minutes was spent in the  room with the patient, greater than 50% of which was in counseling/coordination of care regarding diagnosis, treatment, medications, nutrition, and need for follow-up in 3 months.   ASSESSMENT & PLAN: Type 2 diabetes mellitus without complication, without long-term current use of insulin (HCC) Uncontrolled diabetes with hemoglobin A1c at 13.8.  Patient was off metformin for 4 weeks and restarted taking it 4 weeks ago.  Advised to start glipizide 5 mg in the morning.  Diet and exercise encouraged.  Follow-up in 3 months.  Amanda Kent was seen today for diabetes and medication refill.  Diagnoses and all orders for this visit:  Type 2 diabetes mellitus with hyperglycemia, without long-term current use of insulin (HCC) -     HM Diabetes Foot Exam -     metFORMIN (GLUCOPHAGE) 1000 MG tablet; TAKE 1 TABLET BY MOUTH TWICE DAILY WITH A MEAL -     POCT glucose (manual entry) -     POCT glycosylated hemoglobin (Hb A1C) -     glipiZIDE (GLUCOTROL) 5 MG tablet; Take 1 tablet (5 mg total) by mouth daily before breakfast.    Patient Instructions       If you have lab work done today you will be contacted with your lab results within the next 2 weeks.  If you have not heard from [place] then please contact us. The fastest way to get your results is to register for My Chart.   IF you received an x-ray today, you will receive an invoice from [facility]. Please contact [facility] at [phone] with questions or concerns regarding your invoice.   IF you received labwork today, you will receive an invoice from [place]. Please contact [facility] at [phone] with questions or concerns regarding your invoice.   Our billing staff will not be able to assist you with questions regarding bills from these  companies.  You will be contacted with the lab results as soon as they are available. The fastest way to get your results is to activate your My Chart account. Instructions are located on the last page of this paperwork. If you have not heard from [place] regarding the results in 2 weeks, please contact this office.     Diabetes Mellitus and Nutrition When you have diabetes (diabetes mellitus), it is very important to have healthy eating habits because your blood sugar (glucose) levels are greatly affected by what you eat and drink. Eating healthy foods in the appropriate amounts, at about the same times every day, can help you:  Control your blood glucose.  Lower your risk of heart disease.  Improve your blood pressure.  Reach or maintain a healthy weight.  Every person with diabetes is different, and each person has different needs for a meal plan. Your health care provider may recommend that you work with a diet and nutrition specialist (dietitian) to make a meal plan that is best for you. Your meal plan may vary depending on factors such as:  The calories you need.  The medicines you take.  Your weight.  Your blood glucose, blood pressure, and cholesterol levels.  Your activity level.  Other health conditions you have, such as heart or kidney disease.  How do carbohydrates affect me? Carbohydrates affect your blood glucose level more than any other type of food. Eating carbohydrates naturally increases the amount of glucose in your blood. Carbohydrate counting is a method for keeping track of how many carbohydrates you eat. Counting carbohydrates is important to keep your blood glucose at a healthy  level, especially if you use insulin or take certain oral diabetes medicines. It is important to know how many carbohydrates you can safely have in each meal. This is different for every person. Your dietitian can help you calculate how many carbohydrates you should have at each meal and  for snack. Foods that contain carbohydrates include:  Bread, cereal, rice, pasta, and crackers.  Potatoes and corn.  Peas, beans, and lentils.  Milk and yogurt.  Fruit and juice.  Desserts, such as cakes, cookies, ice cream, and candy.  How does alcohol affect me? Alcohol can cause a sudden decrease in blood glucose (hypoglycemia), especially if you use insulin or take certain oral diabetes medicines. Hypoglycemia can be a life-threatening condition. Symptoms of hypoglycemia (sleepiness, dizziness, and confusion) are similar to symptoms of having too much alcohol. If your health care provider says that alcohol is safe for you, follow these guidelines:  Limit alcohol intake to no more than 1 drink per day for nonpregnant women and 2 drinks per day for men. One drink equals 12 oz of beer, 5 oz of wine, or 1 oz of hard liquor.  Do not drink on an empty stomach.  Keep yourself hydrated with water, diet soda, or unsweetened iced tea.  Keep in mind that regular soda, juice, and other mixers may contain a lot of sugar and must be counted as carbohydrates.  What are tips for following this plan? Reading food labels  Start by checking the serving size on the label. The amount of calories, carbohydrates, fats, and other nutrients listed on the label are based on one serving of the food. Many foods contain more than one serving per package.  Check the total grams (g) of carbohydrates in one serving. You can calculate the number of servings of carbohydrates in one serving by dividing the total carbohydrates by 15. For example, if a food has 30 g of total carbohydrates, it would be equal to 2 servings of carbohydrates.  Check the number of grams (g) of saturated and trans fats in one serving. Choose foods that have low or no amount of these fats.  Check the number of milligrams (mg) of sodium in one serving. Most people should limit total sodium intake to less than 2,300 mg per day.  Always  check the nutrition information of foods labeled as "low-fat" or "nonfat". These foods may be higher in added sugar or refined carbohydrates and should be avoided.  Talk to your dietitian to identify your daily goals for nutrients listed on the label. Shopping  Avoid buying canned, premade, or processed foods. These foods tend to be high in fat, sodium, and added sugar.  Shop around the outside edge of the grocery store. This includes fresh fruits and vegetables, bulk grains, fresh meats, and fresh dairy. Cooking  Use low-heat cooking methods, such as baking, instead of high-heat cooking methods like deep frying.  Cook using healthy oils, such as olive, canola, or sunflower oil.  Avoid cooking with butter, cream, or high-fat meats. Meal planning  Eat meals and snacks regularly, preferably at the same times every day. Avoid going long periods of time without eating.  Eat foods high in fiber, such as fresh fruits, vegetables, beans, and whole grains. Talk to your dietitian about how many servings of carbohydrates you can eat at each meal.  Eat 4-6 ounces of lean protein each day, such as lean meat, chicken, fish, eggs, or tofu. 1 ounce is equal to 1 ounce of meat, chicken, or  fish, 1 egg, or 1/4 cup of tofu.  Eat some foods each day that contain healthy fats, such as avocado, nuts, seeds, and fish. Lifestyle   Check your blood glucose regularly.  Exercise at least 30 minutes 5 or more days each week, or as told by your health care provider.  Take medicines as told by your health care provider.  Do not use any products that contain nicotine or tobacco, such as cigarettes and e-cigarettes. If you need help quitting, ask your health care provider.  Work with a Veterinary surgeoncounselor or diabetes educator to identify strategies to manage stress and any emotional and social challenges. What are some questions to ask my health care provider?  Do I need to meet with a diabetes educator?  Do I need  to meet with a dietitian?  What number can I call if I have questions?  When are the best times to check my blood glucose? Where to find more information:  American Diabetes Association: diabetes.org/food-and-fitness/food  Academy of Nutrition and Dietetics: https://www.vargas.com/www.eatright.org/resources/health/diseases-and-conditions/diabetes  [employer] of Diabetes and Digestive and Kidney Diseases (NIH): FindJewelers.czwww.niddk.nih.gov/health-information/diabetes/overview/diet-eating-physical-activity Summary  A healthy meal plan will help you control your blood glucose and maintain a healthy lifestyle.  Working with a diet and nutrition specialist (dietitian) can help you make a meal plan that is best for you.  Keep in mind that carbohydrates and alcohol have immediate effects on your blood glucose levels. It is important to count carbohydrates and to use alcohol carefully. This information is not intended to replace advice given to you by your health care provider. Make sure you discuss any questions you have with your health care provider. Document Released: 09/20/2004 Document Revised: 01/29/2016 Document Reviewed: 01/29/2016 Elsevier Interactive Patient Education  2018 [employer]      Edwina BarthMiguel Stevee Valenta, MD [facility]

## 2017-12-18 NOTE — Patient Instructions (Addendum)
   If you have lab work done today you will be contacted with your lab results within the next 2 weeks.  If you have not heard from us then please contact us. The fastest way to get your results is to register for My Chart.   IF you received an x-ray today, you will receive an invoice from [facility]. Please contact [facility] at [phone] with questions or concerns regarding your invoice.   IF you received labwork today, you will receive an invoice from [facility]. Please contact [facility] at [phone] with questions or concerns regarding your invoice.   Our billing staff will not be able to assist you with questions regarding bills from these companies.  You will be contacted with the lab results as soon as they are available. The fastest way to get your results is to activate your My Chart account. Instructions are located on the last page of this paperwork. If you have not heard from us regarding the results in 2 weeks, please contact this office.     Diabetes Mellitus and Nutrition When you have diabetes (diabetes mellitus), it is very important to have healthy eating habits because your blood sugar (glucose) levels are greatly affected by what you eat and drink. Eating healthy foods in the appropriate amounts, at about the same times every day, can help you:  Control your blood glucose.  Lower your risk of heart disease.  Improve your blood pressure.  Reach or maintain a healthy weight.  Every person with diabetes is different, and each person has different needs for a meal plan. Your health care provider may recommend that you work with a diet and nutrition specialist (dietitian) to make a meal plan that is best for you. Your meal plan may vary depending on factors such as:  The calories you need.  The medicines you take.  Your weight.  Your blood glucose, blood pressure, and cholesterol levels.  Your activity level.  Other health conditions you  have, such as heart or kidney disease.  How do carbohydrates affect me? Carbohydrates affect your blood glucose level more than any other type of food. Eating carbohydrates naturally increases the amount of glucose in your blood. Carbohydrate counting is a method for keeping track of how many carbohydrates you eat. Counting carbohydrates is important to keep your blood glucose at a healthy level, especially if you use insulin or take certain oral diabetes medicines. It is important to know how many carbohydrates you can safely have in each meal. This is different for every person. Your dietitian can help you calculate how many carbohydrates you should have at each meal and for snack. Foods that contain carbohydrates include:  Bread, cereal, rice, pasta, and crackers.  Potatoes and corn.  Peas, beans, and lentils.  Milk and yogurt.  Fruit and juice.  Desserts, such as cakes, cookies, ice cream, and candy.  How does alcohol affect me? Alcohol can cause a sudden decrease in blood glucose (hypoglycemia), especially if you use insulin or take certain oral diabetes medicines. Hypoglycemia can be a life-threatening condition. Symptoms of hypoglycemia (sleepiness, dizziness, and confusion) are similar to symptoms of having too much alcohol. If your health care provider says that alcohol is safe for you, follow these guidelines:  Limit alcohol intake to no more than 1 drink per day for nonpregnant women and 2 drinks per day for men. One drink equals 12 oz of beer, 5 oz of wine, or 1 oz of hard liquor.  Do   not drink on an empty stomach.  Keep yourself hydrated with water, diet soda, or unsweetened iced tea.  Keep in mind that regular soda, juice, and other mixers may contain a lot of sugar and must be counted as carbohydrates.  What are tips for following this plan? Reading food labels  Start by checking the serving size on the label. The amount of calories, carbohydrates, fats, and other  nutrients listed on the label are based on one serving of the food. Many foods contain more than one serving per package.  Check the total grams (g) of carbohydrates in one serving. You can calculate the number of servings of carbohydrates in one serving by dividing the total carbohydrates by 15. For example, if a food has 30 g of total carbohydrates, it would be equal to 2 servings of carbohydrates.  Check the number of grams (g) of saturated and trans fats in one serving. Choose foods that have low or no amount of these fats.  Check the number of milligrams (mg) of sodium in one serving. Most people should limit total sodium intake to less than 2,300 mg per day.  Always check the nutrition information of foods labeled as "low-fat" or "nonfat". These foods may be higher in added sugar or refined carbohydrates and should be avoided.  Talk to your dietitian to identify your daily goals for nutrients listed on the label. Shopping  Avoid buying canned, premade, or processed foods. These foods tend to be high in fat, sodium, and added sugar.  Shop around the outside edge of the grocery store. This includes fresh fruits and vegetables, bulk grains, fresh meats, and fresh dairy. Cooking  Use low-heat cooking methods, such as baking, instead of high-heat cooking methods like deep frying.  Cook using healthy oils, such as olive, canola, or sunflower oil.  Avoid cooking with butter, cream, or high-fat meats. Meal planning  Eat meals and snacks regularly, preferably at the same times every day. Avoid going long periods of time without eating.  Eat foods high in fiber, such as fresh fruits, vegetables, beans, and whole grains. Talk to your dietitian about how many servings of carbohydrates you can eat at each meal.  Eat 4-6 ounces of lean protein each day, such as lean meat, chicken, fish, eggs, or tofu. 1 ounce is equal to 1 ounce of meat, chicken, or fish, 1 egg, or 1/4 cup of tofu.  Eat some  foods each day that contain healthy fats, such as avocado, nuts, seeds, and fish. Lifestyle   Check your blood glucose regularly.  Exercise at least 30 minutes 5 or more days each week, or as told by your health care provider.  Take medicines as told by your health care provider.  Do not use any products that contain nicotine or tobacco, such as cigarettes and e-cigarettes. If you need help quitting, ask your health care provider.  Work with a counselor or diabetes educator to identify strategies to manage stress and any emotional and social challenges. What are some questions to ask my health care provider?  Do I need to meet with a diabetes educator?  Do I need to meet with a dietitian?  What number can I call if I have questions?  When are the best times to check my blood glucose? Where to find more information:  American Diabetes Association: diabetes.org/food-and-fitness/food  Academy of Nutrition and Dietetics: www.eatright.org/resources/health/diseases-and-conditions/diabetes  National Institute of Diabetes and Digestive and Kidney Diseases (NIH): www.niddk.nih.gov/health-information/diabetes/overview/diet-eating-physical-activity Summary  A healthy meal plan will   help you control your blood glucose and maintain a healthy lifestyle.  Working with a diet and nutrition specialist (dietitian) can help you make a meal plan that is best for you.  Keep in mind that carbohydrates and alcohol have immediate effects on your blood glucose levels. It is important to count carbohydrates and to use alcohol carefully. This information is not intended to replace advice given to you by your health care provider. Make sure you discuss any questions you have with your health care provider. Document Released: 09/20/2004 Document Revised: 01/29/2016 Document Reviewed: 01/29/2016 Elsevier Interactive Patient Education  2018 Elsevier Inc.  

## 2018-01-25 ENCOUNTER — Other Ambulatory Visit: Payer: Self-pay

## 2018-01-25 ENCOUNTER — Observation Stay (HOSPITAL_COMMUNITY)
Admission: EM | Admit: 2018-01-25 | Discharge: 2018-01-27 | Disposition: A | Discharge status: Home / Self Care | Payer: BLUE CROSS/BLUE SHIELD | Attending: Internal Medicine | Admitting: Internal Medicine

## 2018-01-25 ENCOUNTER — Encounter (HOSPITAL_COMMUNITY): Payer: Self-pay

## 2018-01-25 DIAGNOSIS — Z833 Family history of diabetes mellitus: Secondary | ICD-10-CM | POA: Diagnosis not present

## 2018-01-25 DIAGNOSIS — Z79899 Other long term (current) drug therapy: Secondary | ICD-10-CM | POA: Insufficient documentation

## 2018-01-25 DIAGNOSIS — Z7984 Long term (current) use of oral hypoglycemic drugs: Secondary | ICD-10-CM | POA: Insufficient documentation

## 2018-01-25 DIAGNOSIS — E876 Hypokalemia: Secondary | ICD-10-CM | POA: Insufficient documentation

## 2018-01-25 DIAGNOSIS — R112 Nausea with vomiting, unspecified: Secondary | ICD-10-CM | POA: Diagnosis present

## 2018-01-25 DIAGNOSIS — R739 Hyperglycemia, unspecified: Secondary | ICD-10-CM

## 2018-01-25 DIAGNOSIS — E1110 Type 2 diabetes mellitus with ketoacidosis without coma: Secondary | ICD-10-CM | POA: Diagnosis not present

## 2018-01-25 DIAGNOSIS — Z793 Long term (current) use of hormonal contraceptives: Secondary | ICD-10-CM | POA: Diagnosis not present

## 2018-01-25 DIAGNOSIS — E1169 Type 2 diabetes mellitus with other specified complication: Secondary | ICD-10-CM

## 2018-01-25 DIAGNOSIS — E1165 Type 2 diabetes mellitus with hyperglycemia: Secondary | ICD-10-CM

## 2018-01-25 DIAGNOSIS — E872 Acidosis: Secondary | ICD-10-CM

## 2018-01-25 DIAGNOSIS — J45909 Unspecified asthma, uncomplicated: Secondary | ICD-10-CM | POA: Insufficient documentation

## 2018-01-25 DIAGNOSIS — E0810 Diabetes mellitus due to underlying condition with ketoacidosis without coma: Secondary | ICD-10-CM

## 2018-01-25 LAB — CBG MONITORING, ED: Glucose-Capillary: 495 mg/dL — ABNORMAL HIGH (ref 70–99)

## 2018-01-25 MED ORDER — SODIUM CHLORIDE 0.9 % IV BOLUS
1000.0000 mL | Freq: Once | INTRAVENOUS | Status: AC
  Administered 2018-01-25: 1000 mL via INTRAVENOUS

## 2018-01-25 MED ORDER — INSULIN ASPART 100 UNIT/ML IV SOLN
5.0000 [IU] | Freq: Once | INTRAVENOUS | Status: AC
  Administered 2018-01-26: 5 [IU] via INTRAVENOUS
  Filled 2018-01-25: qty 0.05

## 2018-01-25 MED ORDER — GLIPIZIDE 5 MG PO TABS: 5.0000 mg | ORAL_TABLET | Freq: Every day | ORAL | 0 refills | Status: DC

## 2018-01-25 MED ORDER — METFORMIN HCL 1000 MG PO TABS: ORAL_TABLET | ORAL | 0 refills | Status: DC

## 2018-01-25 MED ORDER — ONDANSETRON HCL 4 MG/2ML IJ SOLN
4.0000 mg | Freq: Once | INTRAMUSCULAR | Status: AC
  Administered 2018-01-25: 4 mg via INTRAVENOUS
  Filled 2018-01-25: qty 2

## 2018-01-25 NOTE — ED Triage Notes (Signed)
States today not feeling well and vomiting at home doesn't remember if she took her medication today and states blood sugar 473 at home.

## 2018-01-25 NOTE — ED Provider Notes (Signed)
[facility]-EMERGENCY DEPT Provider Note   CSN: [id number] Arrival date & time: 01/25/18  2206     History   Chief Complaint Chief Complaint  Patient presents with  . Hyperglycemia    vomiting  . Emesis    HPI Amanda Kent is a 23 y.o. female.  The history is provided by the patient.  Hyperglycemia  Associated symptoms: vomiting   Emesis  She has history of asthma and diabetes and comes in because of vomiting and high blood sugar.  She had been drinking alcohol today, and this evening developed nausea and vomiting.  She thinks he vomited 3 or 4 times.  She is no longer complaining of nausea.  She denies any abdominal pain.  She denies fever or chills.  She denies diarrhea.  Glucose at home was 470.  Past Medical History:  Diagnosis Date  . Allergy   . Asthma   . Diabetes mellitus without complication [facility](HCC)     Patient Active Problem List   Diagnosis Date Noted  . LGSIL on Pap smear of cervix 03/26/2017  . Type 2 diabetes mellitus without complication, without long-term current use of insulin (HCC) 02/07/2015  . BMI 32.0-32.9,adult 02/07/2015    Past Surgical History:  Procedure Laterality Date  . osteomax2    . OSTEOTOMY       OB History    Gravida  0   Para  0   Term  0   Preterm  0   AB  0   Living  0     SAB  0   TAB  0   Ectopic  0   Multiple  0   Live Births  0            Home Medications    Prior to Admission medications   Medication Sig Start Date End Date Taking? Authorizing Provider  benzonatate (TESSALON) 100 MG capsule Take 2 capsules (200 mg total) by mouth 2 (two) times daily as needed for cough. Patient not taking: Reported on 12/11/2017 04/10/17   Roxy HorsemanBrowning, Robert, PA-C  glipiZIDE (GLUCOTROL) 5 MG tablet Take 1 tablet (5 mg total) by mouth daily before breakfast. 12/18/17   Georgina QuintSagardia, Miguel Jose, MD  medroxyPROGESTERone (DEPO-PROVERA) 150 MG/ML injection ADMINISTER 1 ML(150 MG) IN THE MUSCLE EVERY  3 MONTHS 11/26/17   Brock BadHarper, Charles A, MD  metFORMIN (GLUCOPHAGE) 1000 MG tablet TAKE 1 TABLET BY MOUTH TWICE DAILY WITH A MEAL 12/18/17   Georgina QuintSagardia, Miguel Jose, MD    Family History Family History  Problem Relation Age of Onset  . Diabetes Mother   . Heart disease Mother   . Hypertension Mother     Social History Social History   Tobacco Use  . Smoking status: Never Smoker  . Smokeless tobacco: Never Used  Substance Use Topics  . Alcohol use: No    Alcohol/week: 0.0 standard drinks  . Drug use: No     Allergies   Patient has no known allergies.   Review of Systems Review of Systems  Gastrointestinal: Positive for vomiting.  All other systems reviewed and are negative.    Physical Exam Updated Vital Signs BP 125/81 (BP Location: Right Arm)   Pulse (!) 107   Temp 98.4 F (36.9 C) (Oral)   Resp 18   Ht 5\' 5"  (1.651 m)   SpO2 95%   BMI 31.68 kg/m   Physical Exam Vitals signs and nursing note reviewed.    23 year old female, resting  comfortably and in no acute distress. Vital signs are significant for rapid heart rate. Oxygen saturation is 95%, which is normal. Head is normocephalic and atraumatic. PERRLA, EOMI. Oropharynx is clear. Neck is nontender and supple without adenopathy or JVD. Back is nontender and there is no CVA tenderness. Lungs are clear without rales, wheezes, or rhonchi. Chest is nontender. Heart has regular rate and rhythm without murmur. Abdomen is soft, flat, nontender without masses or hepatosplenomegaly and peristalsis is hypoactive. Extremities have no cyanosis or edema, full range of motion is present. Skin is warm and dry without rash. Neurologic: Mental status is normal, cranial nerves are intact, there are no motor or sensory deficits.  ED Treatments / Results  Labs (all labs ordered are listed, but only abnormal results are displayed) Labs Reviewed  BASIC METABOLIC PANEL - Abnormal; Notable for the following components:       Result Value   CO2 17 (*)    Glucose, Bld 467 (*)    Anion gap 17 (*)    All other components within normal limits  URINALYSIS, ROUTINE W REFLEX MICROSCOPIC - Abnormal; Notable for the following components:   Color, Urine STRAW (*)    Glucose, UA >=500 (*)    Ketones, ur 20 (*)    All other components within normal limits  CBC WITH DIFFERENTIAL/PLATELET - Abnormal; Notable for the following components:   WBC 15.2 (*)    Neutro Abs 11.6 (*)    Abs Immature Granulocytes 0.21 (*)    All other components within normal limits  CBG MONITORING, ED - Abnormal; Notable for the following components:   Glucose-Capillary 495 (*)    All other components within normal limits  CBG MONITORING, ED - Abnormal; Notable for the following components:   Glucose-Capillary 317 (*)    All other components within normal limits  BLOOD GAS, VENOUS  I-STAT BETA HCG BLOOD, ED ([facility], [facility], [facility] ONLY)   Procedures Procedures  CRITICAL CARE Performed by: Dorathy Daftavid GlickTotal critical care time: 35 minutes Critical care time was exclusive of separately billable procedures and treating other patients. Critical care was necessary to treat or prevent imminent or life-threatening deterioration. Critical care was time spent personally by me on the following activities: development of treatment plan with patient and/or surrogate as well as nursing, discussions with consultants, evaluation of patient's response to treatment, examination of patient, obtaining history from patient or surrogate, ordering and performing treatments and interventions, ordering and review of laboratory studies, ordering and review of radiographic studies, pulse oximetry and re-evaluation of patient's condition.  Medications Ordered in ED Medications  dextrose 5 %-0.45 % sodium chloride infusion (has no administration in time range)  insulin regular bolus via infusion 0-10 Units (has no administration in time range)  insulin regular, human (MYXREDLIN) 100  units/ 100 mL infusion (has no administration in time range)  dextrose 50 % solution 25 mL (has no administration in time range)  0.9 %  sodium chloride infusion (has no administration in time range)  sodium chloride 0.9 % bolus 1,000 mL ( Intravenous Stopped 01/26/18 0116)  ondansetron (ZOFRAN) injection 4 mg (4 mg Intravenous Given 01/25/18 2339)  insulin aspart (novoLOG) injection 5 Units (5 Units Intravenous Given 01/26/18 0019)     Initial Impression / Assessment and Plan / ED Course  I have reviewed the triage vital signs and the nursing notes.  Pertinent lab results that were available during my care of the patient were reviewed by me and considered in my medical decision  making (see chart for details).  Nausea and vomiting which may be viral gastritis, possibly from alcohol intoxication.  Glucose in the emergency department is found to 95.  Old records are reviewed, and she has a prior ED visit for vomiting, no ED visits for hyperglycemia.  No hospitalizations for ketoacidosis.  She is given IV fluids, ondansetron, insulin.  Labs show a low CO2 of 17 with an elevated anion gap of 17.  This could represent early ketoacidosis versus metabolic acidosis secondary to vomiting.  Urine does have small ketones, but this could also be from starvation ketosis.  VBG is ordered.  She is started on insulin drip.  Case is discussed with Dr. Toniann Fail of [facility] who agrees to admit the patient.  Final Clinical Impressions(s) / ED Diagnoses   Final diagnoses:  Hyperglycemia  Non-intractable vomiting with nausea, unspecified vomiting type  Metabolic acidosis due to diabetes mellitus [facility])    ED Discharge Orders    None       Dione Booze, MD 01/26/18 [phone]

## 2018-01-26 ENCOUNTER — Other Ambulatory Visit: Payer: Self-pay

## 2018-01-26 ENCOUNTER — Observation Stay (HOSPITAL_COMMUNITY): Payer: BLUE CROSS/BLUE SHIELD

## 2018-01-26 ENCOUNTER — Encounter (HOSPITAL_COMMUNITY): Payer: Self-pay | Admitting: Internal Medicine

## 2018-01-26 DIAGNOSIS — E0810 Diabetes mellitus due to underlying condition with ketoacidosis without coma: Secondary | ICD-10-CM

## 2018-01-26 DIAGNOSIS — E1110 Type 2 diabetes mellitus with ketoacidosis without coma: Secondary | ICD-10-CM | POA: Diagnosis present

## 2018-01-26 DIAGNOSIS — R112 Nausea with vomiting, unspecified: Secondary | ICD-10-CM | POA: Diagnosis present

## 2018-01-26 LAB — BASIC METABOLIC PANEL
ANION GAP: 20 — AB (ref 5–15)
Anion gap: 17 — ABNORMAL HIGH (ref 5–15)
Anion gap: 8 (ref 5–15)
Anion gap: 9 (ref 5–15)
BUN: 14 mg/dL (ref 6–20)
BUN: 13 mg/dL (ref 6–20)
BUN: 14 mg/dL (ref 6–20)
BUN: 16 mg/dL (ref 6–20)
CO2: 17 mmol/L — ABNORMAL LOW (ref 22–32)
CO2: 13 mmol/L — ABNORMAL LOW (ref 22–32)
CO2: 23 mmol/L (ref 22–32)
CO2: 21 mmol/L — ABNORMAL LOW (ref 22–32)
Calcium: 9.2 mg/dL (ref 8.9–10.3)
Calcium: 9.4 mg/dL (ref 8.9–10.3)
Calcium: 8.7 mg/dL — ABNORMAL LOW (ref 8.9–10.3)
Calcium: 8.7 mg/dL — ABNORMAL LOW (ref 8.9–10.3)
Chloride: 103 mmol/L (ref 98–111)
Chloride: 108 mmol/L (ref 98–111)
Chloride: 110 mmol/L (ref 98–111)
Chloride: 110 mmol/L (ref 98–111)
Creatinine, Ser: 0.81 mg/dL (ref 0.44–1.00)
Creatinine, Ser: 0.58 mg/dL (ref 0.44–1.00)
Creatinine, Ser: 0.67 mg/dL (ref 0.44–1.00)
Creatinine, Ser: 0.6 mg/dL (ref 0.44–1.00)
GFR calc Af Amer: >60 mL/min (ref >60)
GFR calc Af Amer: >60 mL/min (ref >60)
GFR calc Af Amer: >60 mL/min (ref >60)
GFR calc Af Amer: >60 mL/min (ref >60)
GFR calc non Af Amer: >60 mL/min (ref >60)
GFR calc non Af Amer: >60 mL/min (ref >60)
GFR calc non Af Amer: >60 mL/min (ref >60)
GFR calc non Af Amer: >60 mL/min (ref >60)
Glucose, Bld: 467 mg/dL — ABNORMAL HIGH (ref 70–99)
Glucose, Bld: 322 mg/dL — ABNORMAL HIGH (ref 70–99)
Glucose, Bld: 115 mg/dL — ABNORMAL HIGH (ref 70–99)
Glucose, Bld: 184 mg/dL — ABNORMAL HIGH (ref 70–99)
Potassium: 4.1 mmol/L (ref 3.5–5.1)
Potassium: 4.1 mmol/L (ref 3.5–5.1)
Potassium: 2.9 mmol/L — ABNORMAL LOW (ref 3.5–5.1)
Potassium: 4.1 mmol/L (ref 3.5–5.1)
Sodium: 137 mmol/L (ref 135–145)
Sodium: 141 mmol/L (ref 135–145)
Sodium: 141 mmol/L (ref 135–145)
Sodium: 140 mmol/L (ref 135–145)

## 2018-01-26 LAB — GLUCOSE, CAPILLARY
GLUCOSE-CAPILLARY: 139 mg/dL — AB (ref 70–99)
Glucose-Capillary: 255 mg/dL — ABNORMAL HIGH (ref 70–99)
Glucose-Capillary: 242 mg/dL — ABNORMAL HIGH (ref 70–99)
Glucose-Capillary: 208 mg/dL — ABNORMAL HIGH (ref 70–99)
Glucose-Capillary: 166 mg/dL — ABNORMAL HIGH (ref 70–99)
Glucose-Capillary: 66 mg/dL — ABNORMAL LOW (ref 70–99)
Glucose-Capillary: 206 mg/dL — ABNORMAL HIGH (ref 70–99)
Glucose-Capillary: 191 mg/dL — ABNORMAL HIGH (ref 70–99)
Glucose-Capillary: 302 mg/dL — ABNORMAL HIGH (ref 70–99)
Glucose-Capillary: 250 mg/dL — ABNORMAL HIGH (ref 70–99)

## 2018-01-26 LAB — CBC WITH DIFFERENTIAL/PLATELET
Abs Immature Granulocytes: 0.21 10*3/uL — ABNORMAL HIGH (ref 0.00–0.07)
Basophils Absolute: 0 10*3/uL (ref 0.0–0.1)
Basophils Relative: 0 %
Eosinophils Absolute: 0 10*3/uL (ref 0.0–0.5)
Eosinophils Relative: 0 %
HCT: 45.7 % (ref 36.0–46.0)
Hemoglobin: 14 g/dL (ref 12.0–15.0)
IMMATURE GRANULOCYTES: 1 %
Lymphocytes Relative: 20 %
Lymphs Abs: 3 10*3/uL (ref 0.7–4.0)
MCH: 27.6 pg (ref 26.0–34.0)
MCHC: 30.6 g/dL (ref 30.0–36.0)
MCV: 90.1 fL (ref 80.0–100.0)
Monocytes Absolute: 0.4 10*3/uL (ref 0.1–1.0)
Monocytes Relative: 2 %
NEUTROS PCT: 77 %
NRBC: 0 % (ref 0.0–0.2)
Neutro Abs: 11.6 10*3/uL — ABNORMAL HIGH (ref 1.7–7.7)
Platelets: 318 10*3/uL (ref 150–400)
RBC: 5.07 MIL/uL (ref 3.87–5.11)
RDW: 12.3 % (ref 11.5–15.5)
WBC: 15.2 10*3/uL — ABNORMAL HIGH (ref 4.0–10.5)

## 2018-01-26 LAB — CBC
HEMATOCRIT: 47.5 % — AB (ref 36.0–46.0)
Hemoglobin: 14.6 g/dL (ref 12.0–15.0)
MCH: 28.1 pg (ref 26.0–34.0)
MCHC: 30.7 g/dL (ref 30.0–36.0)
MCV: 91.3 fL (ref 80.0–100.0)
Platelets: 316 10*3/uL (ref 150–400)
RBC: 5.2 MIL/uL — ABNORMAL HIGH (ref 3.87–5.11)
RDW: 12.3 % (ref 11.5–15.5)
WBC: 15.9 10*3/uL — ABNORMAL HIGH (ref 4.0–10.5)
nRBC: 0 % (ref 0.0–0.2)

## 2018-01-26 LAB — HEPATIC FUNCTION PANEL
ALT: 13 U/L (ref 0–44)
AST: 12 U/L — ABNORMAL LOW (ref 15–41)
Albumin: 3.7 g/dL (ref 3.5–5.0)
Alkaline Phosphatase: 50 U/L (ref 38–126)
Bilirubin, Direct: 0.1 mg/dL (ref 0.0–0.2)
Indirect Bilirubin: 0.5 mg/dL (ref 0.3–0.9)
Total Bilirubin: 0.6 mg/dL (ref 0.3–1.2)
Total Protein: 6.8 g/dL (ref 6.5–8.1)

## 2018-01-26 LAB — I-STAT BETA HCG BLOOD, ED (MC, WL, AP ONLY): I-stat hCG, quantitative: <5 m[IU]/mL (ref <5)

## 2018-01-26 LAB — RAPID URINE DRUG SCREEN, HOSP PERFORMED
Amphetamines: NOT DETECTED
BARBITURATES: NOT DETECTED
Benzodiazepines: NOT DETECTED
Cocaine: NOT DETECTED
Opiates: NOT DETECTED
Tetrahydrocannabinol: NOT DETECTED

## 2018-01-26 LAB — RESPIRATORY PANEL BY PCR

## 2018-01-26 LAB — URINALYSIS, ROUTINE W REFLEX MICROSCOPIC
Bacteria, UA: NONE SEEN
Bilirubin Urine: NEGATIVE
Glucose, UA: >=500 mg/dL — AB
HGB URINE DIPSTICK: NEGATIVE
Ketones, ur: 20 mg/dL — AB
LEUKOCYTES UA: NEGATIVE
Nitrite: NEGATIVE
Protein, ur: NEGATIVE mg/dL
Specific Gravity, Urine: 1.03 (ref 1.005–1.030)
pH: 5 (ref 5.0–8.0)

## 2018-01-26 LAB — LIPASE, BLOOD: LIPASE: 40 U/L (ref 11–51)

## 2018-01-26 LAB — TSH: TSH: 0.742 u[IU]/mL (ref 0.350–4.500)

## 2018-01-26 LAB — HEMOGLOBIN A1C
Hgb A1c MFr Bld: 12.8 % — ABNORMAL HIGH (ref 4.8–5.6)
Mean Plasma Glucose: 320.66 mg/dL

## 2018-01-26 LAB — MRSA PCR SCREENING: MRSA by PCR: NEGATIVE

## 2018-01-26 LAB — CBG MONITORING, ED
Glucose-Capillary: 317 mg/dL — ABNORMAL HIGH (ref 70–99)
Glucose-Capillary: 335 mg/dL — ABNORMAL HIGH (ref 70–99)

## 2018-01-26 MED ORDER — SODIUM CHLORIDE 0.9 % IV SOLN
1.0000 g | INTRAVENOUS | Status: DC
  Administered 2018-01-26: 1 g via INTRAVENOUS
  Filled 2018-01-26 (×2): qty 10

## 2018-01-26 MED ORDER — SODIUM CHLORIDE 0.9 % IV SOLN
INTRAVENOUS | Status: DC
  Administered 2018-01-26: 03:00:00 via INTRAVENOUS

## 2018-01-26 MED ORDER — DEXTROSE-NACL 5-0.45 % IV SOLN
INTRAVENOUS | Status: DC
  Administered 2018-01-26: 07:00:00 via INTRAVENOUS

## 2018-01-26 MED ORDER — LIVING WELL WITH DIABETES BOOK
Freq: Once | Status: AC
  Administered 2018-01-26: 14:00:00
  Filled 2018-01-26: qty 1

## 2018-01-26 MED ORDER — INSULIN STARTER KIT- PEN NEEDLES (ENGLISH)
1.0000 | Freq: Once | Status: AC
  Administered 2018-01-26: 1
  Filled 2018-01-26: qty 1

## 2018-01-26 MED ORDER — POTASSIUM CHLORIDE CRYS ER 20 MEQ PO TBCR
40.0000 meq | EXTENDED_RELEASE_TABLET | Freq: Once | ORAL | Status: AC
  Administered 2018-01-26: 40 meq via ORAL
  Filled 2018-01-26: qty 2

## 2018-01-26 MED ORDER — INSULIN REGULAR BOLUS VIA INFUSION
0.0000 [IU] | Freq: Three times a day (TID) | INTRAVENOUS | Status: DC
  Filled 2018-01-26: qty 10

## 2018-01-26 MED ORDER — DEXTROSE 50 % IV SOLN
INTRAVENOUS | Status: AC
  Administered 2018-01-26: 14 mL
  Filled 2018-01-26: qty 50

## 2018-01-26 MED ORDER — INSULIN ASPART 100 UNIT/ML ~~LOC~~ SOLN
0.0000 [IU] | Freq: Three times a day (TID) | SUBCUTANEOUS | Status: DC
  Administered 2018-01-26: 2 [IU] via SUBCUTANEOUS
  Administered 2018-01-26: 7 [IU] via SUBCUTANEOUS
  Administered 2018-01-27: 5 [IU] via SUBCUTANEOUS

## 2018-01-26 MED ORDER — INSULIN GLARGINE 100 UNIT/ML ~~LOC~~ SOLN
10.0000 [IU] | Freq: Every day | SUBCUTANEOUS | Status: DC
  Administered 2018-01-26: 10 [IU] via SUBCUTANEOUS
  Filled 2018-01-26: qty 0.1

## 2018-01-26 MED ORDER — DEXTROSE-NACL 5-0.45 % IV SOLN: INTRAVENOUS | Status: DC

## 2018-01-26 MED ORDER — INSULIN ASPART 100 UNIT/ML ~~LOC~~ SOLN
0.0000 [IU] | Freq: Every day | SUBCUTANEOUS | Status: DC
  Administered 2018-01-26: 2 [IU] via SUBCUTANEOUS

## 2018-01-26 MED ORDER — SODIUM CHLORIDE 0.9 % IV SOLN
INTRAVENOUS | Status: DC
  Administered 2018-01-26 – 2018-01-27 (×2): via INTRAVENOUS

## 2018-01-26 MED ORDER — SODIUM CHLORIDE 0.9 % IV SOLN: INTRAVENOUS | Status: DC

## 2018-01-26 MED ORDER — AZITHROMYCIN 250 MG PO TABS
500.0000 mg | ORAL_TABLET | Freq: Every day | ORAL | Status: DC
  Administered 2018-01-26 – 2018-01-27 (×2): 500 mg via ORAL
  Filled 2018-01-26 (×2): qty 2

## 2018-01-26 MED ORDER — INSULIN GLARGINE 100 UNIT/ML ~~LOC~~ SOLN
15.0000 [IU] | Freq: Every day | SUBCUTANEOUS | Status: DC
  Filled 2018-01-26: qty 0.15

## 2018-01-26 MED ORDER — SODIUM CHLORIDE 0.9 % IV BOLUS
1000.0000 mL | Freq: Once | INTRAVENOUS | Status: AC
  Administered 2018-01-26: 1000 mL via INTRAVENOUS

## 2018-01-26 MED ORDER — POTASSIUM CHLORIDE 10 MEQ/100ML IV SOLN
10.0000 meq | INTRAVENOUS | Status: AC
  Administered 2018-01-26 (×2): 10 meq via INTRAVENOUS
  Filled 2018-01-26 (×2): qty 100

## 2018-01-26 MED ORDER — INSULIN REGULAR(HUMAN) IN NACL 100-0.9 UT/100ML-% IV SOLN: INTRAVENOUS | Status: DC

## 2018-01-26 MED ORDER — INSULIN REGULAR(HUMAN) IN NACL 100-0.9 UT/100ML-% IV SOLN
INTRAVENOUS | Status: DC
  Administered 2018-01-26: 2 [IU]/h via INTRAVENOUS
  Filled 2018-01-26: qty 100

## 2018-01-26 MED ORDER — DEXTROSE 50 % IV SOLN: 25.0000 mL | INTRAVENOUS | Status: DC | PRN

## 2018-01-26 MED ORDER — ENOXAPARIN SODIUM 40 MG/0.4ML ~~LOC~~ SOLN
40.0000 mg | SUBCUTANEOUS | Status: DC
  Filled 2018-01-26: qty 0.4

## 2018-01-26 MED ORDER — INSULIN GLARGINE 100 UNIT/ML ~~LOC~~ SOLN
5.0000 [IU] | Freq: Once | SUBCUTANEOUS | Status: AC
  Administered 2018-01-26: 5 [IU] via SUBCUTANEOUS
  Filled 2018-01-26: qty 0.05

## 2018-01-26 NOTE — H&P (Signed)
History and Physical    ALIYAHNA BURCK Kent:[id number] DOB: 11-28-1995 DOA: 01/25/2018  PCP: Patient, No Pcp Per  Patient coming from: Home.  Chief Complaint: Nausea vomiting.  HPI: Amanda Kent is a 23 y.o. female with history of diabetes mellitus type 2 on metformin and glipizide presents to the ER with complaints of nausea vomiting.  Patient has been having these symptoms for last 24 hours.  Patient states just prior to the nausea vomiting starting patient had 4 beers.  This was more than usual she drinks.  Patient states he does not drink alcohol every day only occasionally.  Denies any abdominal pain chest pain or shortness of breath.  She had 2 episodes of vomiting and continued to have nausea and presented to the ER.  Patient states she was started on glipizide last month because of uncontrolled blood sugar.  ED Course: In the ER patient was tachycardic with blood work showing leukocytosis blood sugar of 467 anion gap of 17 bicarb of 17 UA showing ketones with large amount of sugar patient was started on IV fluid bolus and IV insulin infusion for treatment of DKA.  Abdomen on exam is benign.  Review of Systems: As per HPI, rest all negative.   Past Medical History:  Diagnosis Date  . Allergy   . Asthma   . Diabetes mellitus without complication [facility])     Past Surgical History:  Procedure Laterality Date  . osteomax2    . OSTEOTOMY       reports that she has never smoked. She has never used smokeless tobacco. She reports that she does not drink alcohol or use drugs.  No Known Allergies  Family History  Problem Relation Age of Onset  . Diabetes Mother   . Heart disease Mother   . Hypertension Mother     Prior to Admission medications   Medication Sig Start Date End Date Taking? Authorizing Provider  glipiZIDE (GLUCOTROL) 5 MG tablet Take 1 tablet (5 mg total) by mouth daily before breakfast. 01/25/18  Yes Dione Booze, MD  medroxyPROGESTERone (DEPO-PROVERA)  150 MG/ML injection ADMINISTER 1 ML(150 MG) IN THE MUSCLE EVERY 3 MONTHS Patient taking differently: Inject 150 mg into the muscle every 3 (three) months.  11/26/17  Yes Brock Bad, MD  metFORMIN (GLUCOPHAGE) 1000 MG tablet TAKE 1 TABLET BY MOUTH TWICE DAILY WITH A MEAL Patient taking differently: Take 1,000 mg by mouth 2 (two) times daily with a meal.  01/25/18  Yes Dione Booze, MD    Physical Exam: Vitals:   01/25/18 2226 01/25/18 2231 01/26/18 0025 01/26/18 0100  BP: 125/81  123/69 118/70  Pulse: (!) 107  (!) 102 (!) 101  Resp: 18  18 18   Temp: 98.4 F (36.9 C)     TempSrc: Oral     SpO2: 95%  96% 93%  Height:  5\' 5"  (1.651 m)        Constitutional: Moderately built and nourished. Vitals:   01/25/18 2226 01/25/18 2231 01/26/18 0025 01/26/18 0100  BP: 125/81  123/69 118/70  Pulse: (!) 107  (!) 102 (!) 101  Resp: 18  18 18   Temp: 98.4 F (36.9 C)     TempSrc: Oral     SpO2: 95%  96% 93%  Height:  5\' 5"  (1.651 m)     Eyes: Anicteric no pallor. ENMT: No discharge from the ears eyes nose or mouth. Neck: No mass felt.  No neck rigidity.  No JVD appreciated. Respiratory: No rhonchi  or crepitations. Cardiovascular: S1-S2 heard tachycardic. Abdomen: Soft nontender bowel sounds present. Musculoskeletal: No edema.  No joint effusion. Skin: No rash. Neurologic: Alert awake oriented to time place and person.  Moves all extremities. Psychiatric: Appears normal.  Normal affect.   Labs on Admission: I have personally reviewed following labs and imaging studies  CBC: Recent Labs  Lab 01/25/18 2341  WBC 15.2*  NEUTROABS 11.6*  HGB 14.0  HCT 45.7  MCV 90.1  PLT 318   Basic Metabolic Panel: Recent Labs  Lab 01/25/18 2341  NA 137  K 4.1  CL 103  CO2 17*  GLUCOSE 467*  BUN 14  CREATININE 0.81  CALCIUM 9.2   GFR: CrCl cannot be calculated (Unknown ideal weight.). Liver Function Tests: No results for input(s): AST, ALT, ALKPHOS, BILITOT, PROT, ALBUMIN in  the last 168 hours. No results for input(s): LIPASE, AMYLASE in the last 168 hours. No results for input(s): AMMONIA in the last 168 hours. Coagulation Profile: No results for input(s): INR, PROTIME in the last 168 hours. Cardiac Enzymes: No results for input(s): CKTOTAL, CKMB, CKMBINDEX, TROPONINI in the last 168 hours. BNP (last 3 results) No results for input(s): PROBNP in the last 8760 hours. HbA1C: No results for input(s): HGBA1C in the last 72 hours. CBG: Recent Labs  Lab 01/25/18 2234 01/26/18 0121  GLUCAP 495* 317*   Lipid Profile: No results for input(s): CHOL, HDL, LDLCALC, TRIG, CHOLHDL, LDLDIRECT in the last 72 hours. Thyroid Function Tests: No results for input(s): TSH, T4TOTAL, FREET4, T3FREE, THYROIDAB in the last 72 hours. Anemia Panel: No results for input(s): VITAMINB12, FOLATE, FERRITIN, TIBC, IRON, RETICCTPCT in the last 72 hours. Urine analysis:    Component Value Date/Time   COLORURINE STRAW (A) 01/25/2018 2341   APPEARANCEUR CLEAR 01/25/2018 2341   LABSPEC 1.030 01/25/2018 2341   PHURINE 5.0 01/25/2018 2341   GLUCOSEU >=500 (A) 01/25/2018 2341   HGBUR NEGATIVE 01/25/2018 2341   BILIRUBINUR NEGATIVE 01/25/2018 2341   BILIRUBINUR neg 07/26/2013 1249   KETONESUR 20 (A) 01/25/2018 2341   PROTEINUR NEGATIVE 01/25/2018 2341   UROBILINOGEN 1.0 07/26/2013 1249   UROBILINOGEN 1.0 07/24/2012 1852   NITRITE NEGATIVE 01/25/2018 2341   LEUKOCYTESUR NEGATIVE 01/25/2018 2341   Sepsis Labs: @LABRCNTIP (procalcitonin:4,lacticidven:4) )No results found for this or any previous visit (from the past 240 hour(s)).   Radiological Exams on Admission: No results found.    Assessment/Plan Principal Problem:   DKA (diabetic ketoacidoses) (HCC) Active Problems:   Nausea & vomiting   DKA, type 2 (HCC)    1. Diabetic ketoacidosis -exact precipitating cause not clear.  Will check hemoglobin A1c continue with IV fluids and on IV insulin infusion until anion gap gets  corrected.  Patient may need long-acting insulin for a few days at least at discharge. 2. Nausea vomiting -patient's vomiting started after drinking alcohol but denies any abdominal pain.  LFTs and lipase are pending.  Abdomen appears benign.  Could be either from alcohol etc. or possible gastroenteritis or gastroparesis.  Follow LFTs and lipase. 3. Leukocytosis likely reactionary. 4. Tachycardia could be from DKA.  Will check EKG and TSH.   DVT prophylaxis: Lovenox. Code Status: Full code. Family Communication: Patient's family at the bedside. Disposition Plan: Home. Consults called: None. Admission status: Observation.   Eduard ClosArshad N Farhan Jean MD [facility] Pager [phone].  If 7PM-7AM, please contact night-coverage [email or web address] Password [email or web address]  01/26/2018, 2:12 AM

## 2018-01-26 NOTE — Progress Notes (Signed)
Inpatient Diabetes Program Recommendations  AACE/ADA: New Consensus Statement on Inpatient Glycemic Control (2015)  Target Ranges:  Prepandial:   less than 140 mg/dL      Peak postprandial:   less than 180 mg/dL (1-2 hours)      Critically ill patients:  140 - 180 mg/dL   Lab Results  Component Value Date   GLUCAP 191 (H) 01/26/2018   HGBA1C 12.8 (H) 01/26/2018    Review of Glycemic Control  Diabetes history: DM2 Outpatient Diabetes medications: glipizide 5 mg ac breakfast, metformin  metformin 1000 mg bid Current orders for Inpatient glycemic control: Lantus 15 units QD, Novolog 0-9 units tidwc and hs  HgbA1C - 12.8% - uncontrolled Will likely need meal coverage insulin.   Inpatient Diabetes Program Recommendations:     Novolog 3 units tidwc if post-prandial blood sugars > 180 mg/dL.  Pt prefers insulin pens - will order case manager consult to check insurance coverage/copay for Lantus and Novolog.  Will need prescription for meter and supplies Insulin pen needles. Will order OP Diabetes Education consult for uncontrolled DM and new to insulin.  Long discussion with pt and mother regarding how diet, exercise and stress affect blood sugars and HgbA1C of 12.8%. Stressed importance of blood glucose monitoring and taking meds as prescribed. Pt states she was not taking glipizide on a regular basis, as it made her stomach hurt.  Needs much encouragement to make lifestyle changes to control blood sugars.   Will follow.  Thank you. Ailene Ards, RD, LDN, CDE Inpatient Diabetes Coordinator [phone]

## 2018-01-26 NOTE — ED Notes (Signed)
ED TO INPATIENT HANDOFF REPORT  Name/Age/Gender Amanda Kent 23 y.o. female  Code Status    Code Status Orders  (From admission, onward)         Start     Ordered   01/26/18 0211  Full code  Continuous     01/26/18 0211        Code Status History    This patient has a current code status but no historical code status.      Home/SNF/Other Home  Chief Complaint Elevated Blood Sugar -473  Level of Care/Admitting Diagnosis ED Disposition    ED Disposition Condition Comment   Admit  Hospital Area: [facility] [[id number]]  Level of Care: Stepdown [14]  Admit to SDU based on following criteria: Severe physiological/psychological symptoms:  Any diagnosis requiring assessment & intervention at least every 4 hours on an ongoing basis to obtain desired patient outcomes including stability and rehabilitation  Diagnosis: DKA, type 2 [facility](HCC) [[id number]][[id number]]  Admitting Physician: Eduard ClosKAKRAKANDY, ARSHAD N [phone][[id number]]  Attending Physician: Eduard ClosKAKRAKANDY, ARSHAD N Florian.Pax[[id number]]  PT Class (Do Not Modify): Observation [104]  PT Acc Code (Do Not Modify): Observation [10022]       Medical History Past Medical History:  Diagnosis Date  . Allergy   . Asthma   . Diabetes mellitus without complication (HCC)     Allergies No Known Allergies  IV Location/Drains/Wounds Patient Lines/Drains/Airways Status   Active Line/Drains/Airways    Name:   Placement date:   Placement time:   Site:   Days:   Peripheral IV 01/25/18 Left Hand   01/25/18    2337    Hand   1          Labs/Imaging Results for orders placed or performed during the hospital encounter of 01/25/18 (from the past 48 hour(s))  CBG monitoring, ED     Status: Abnormal   Collection Time: 01/25/18 10:34 PM  Result Value Ref Range   Glucose-Capillary 495 (H) 70 - 99 mg/dL   Comment 1 Notify RN   Basic metabolic panel     Status: Abnormal   Collection Time: 01/25/18 11:41 PM  Result Value Ref Range   Sodium 137 135 - 145  mmol/L   Potassium 4.1 3.5 - 5.1 mmol/L   Chloride 103 98 - 111 mmol/L   CO2 17 (L) 22 - 32 mmol/L   Glucose, Bld 467 (H) 70 - 99 mg/dL   BUN 14 6 - 20 mg/dL   Creatinine, Ser 5.620.81 0.44 - 1.00 mg/dL   Calcium 9.2 8.9 - 13.010.3 mg/dL   GFR calc non Af Amer >60 >60 mL/min   GFR calc Af Amer >60 >60 mL/min   Anion gap 17 (H) 5 - 15    Comment: Performed at [facility], [address] [place], [place] [id number]  Urinalysis, Routine w reflex microscopic     Status: Abnormal   Collection Time: 01/25/18 11:41 PM  Result Value Ref Range   Color, Urine STRAW (A) YELLOW   APPearance CLEAR CLEAR   Specific Gravity, Urine 1.030 1.005 - 1.030   pH 5.0 5.0 - 8.0   Glucose, UA >=500 (A) NEGATIVE mg/dL   Hgb urine dipstick NEGATIVE NEGATIVE   Bilirubin Urine NEGATIVE NEGATIVE   Ketones, ur 20 (A) NEGATIVE mg/dL   Protein, ur NEGATIVE NEGATIVE mg/dL   Nitrite NEGATIVE NEGATIVE   Leukocytes, UA NEGATIVE NEGATIVE   WBC, UA 0-5 0 - 5 WBC/hpf   Bacteria, UA NONE SEEN  NONE SEEN    Comment: Performed at [facility], [address], [place], [place] [address]  CBC with Differential     Status: Abnormal   Collection Time: 01/25/18 11:41 PM  Result Value Ref Range   WBC 15.2 (H) 4.0 - 10.5 K/uL   RBC 5.07 3.87 - 5.11 MIL/uL   Hemoglobin 14.0 12.0 - 15.0 g/dL   HCT 76.8 11.5 - 72.6 %   MCV 90.1 80.0 - 100.0 fL   MCH 27.6 26.0 - 34.0 pg   MCHC 30.6 30.0 - 36.0 g/dL   RDW 20.3 55.9 - 74.1 %   Platelets 318 150 - 400 K/uL   nRBC 0.0 0.0 - 0.2 %   Neutrophils Relative % 77 %   Neutro Abs 11.6 (H) 1.7 - 7.7 K/uL   Lymphocytes Relative 20 %   Lymphs Abs 3.0 0.7 - 4.0 K/uL   Monocytes Relative 2 %   Monocytes Absolute 0.4 0.1 - 1.0 K/uL   Eosinophils Relative 0 %   Eosinophils Absolute 0.0 0.0 - 0.5 K/uL   Basophils Relative 0 %   Basophils Absolute 0.0 0.0 - 0.1 K/uL   Immature Granulocytes 1 %   Abs Immature Granulocytes 0.21 (H) 0.00 - 0.07 K/uL     Comment: Performed at [facility], [address], [place], [place] [address]  I-Stat beta hCG blood, ED     Status: None   Collection Time: 01/26/18 12:30 AM  Result Value Ref Range   I-stat hCG, quantitative <5.0 <5 mIU/mL   Comment 3            Comment:   GEST. AGE      CONC.  (mIU/mL)   <=1 WEEK        5 - 50     2 WEEKS       50 - 500     3 WEEKS       100 - 10,000     4 WEEKS     1,000 - 30,000        FEMALE AND NON-PREGNANT FEMALE:     LESS THAN 5 mIU/mL   CBG monitoring, ED     Status: Abnormal   Collection Time: 01/26/18  1:21 AM  Result Value Ref Range   Glucose-Capillary 317 (H) 70 - 99 mg/dL  CBG monitoring, ED     Status: Abnormal   Collection Time: 01/26/18  2:18 AM  Result Value Ref Range   Glucose-Capillary 335 (H) 70 - 99 mg/dL   No results found.  Pending Labs Unresulted Labs (From admission, onward)    Start     Ordered   02/02/18 0500  Creatinine, serum  (enoxaparin (LOVENOX)    CrCl >/= 30 ml/min)  Weekly,   R    Comments:  while on enoxaparin therapy    01/26/18 0211   01/26/18 0212  Urine rapid drug screen (hosp performed)not at [place]  Once,   R     01/26/18 0211   01/26/18 0211  Basic metabolic panel  STAT Now then every 4 hours ,   STAT     01/26/18 0211   01/26/18 0211  CBC  (enoxaparin (LOVENOX)    CrCl >/= 30 ml/min)  Once,   R    Comments:  Baseline for enoxaparin therapy IF NOT ALREADY DRAWN.  Notify MD if PLT < 100 K.    01/26/18 0211          Vitals/Pain Today's  Vitals   01/25/18 2231 01/26/18 0025 01/26/18 0100 01/26/18 0220  BP:  123/69 118/70 121/62  Pulse:  (!) 102 (!) 101 (!) 108  Resp:  18 18 18   Temp:      TempSrc:      SpO2:  96% 93% 95%  Height: 5\' 5"  (1.651 m)     PainSc: 0-No pain       Isolation Precautions No active isolations  Medications Medications  0.9 %  sodium chloride infusion (has no administration in time range)  dextrose 5 %-0.45 % sodium chloride infusion (has no administration in  time range)  insulin regular, human (MYXREDLIN) 100 units/ 100 mL infusion (has no administration in time range)  enoxaparin (LOVENOX) injection 40 mg (has no administration in time range)  sodium chloride 0.9 % bolus 1,000 mL ( Intravenous Stopped 01/26/18 0116)  ondansetron (ZOFRAN) injection 4 mg (4 mg Intravenous Given 01/25/18 2339)  insulin aspart (novoLOG) injection 5 Units (5 Units Intravenous Given 01/26/18 0019)    Mobility walks

## 2018-01-26 NOTE — Progress Notes (Signed)
PROGRESS NOTE    Amanda Kent  ZOX:[id number] DOB: 01/07/1996 DOA: 01/25/2018 PCP: Patient, No Pcp Per    Brief Narrative: 23 year old with past medical history for diabetes type 2 on metformin and glipizide presents to the ER complaining of nausea and vomiting.  She has been having the symptoms for the last 24 hours prior to admission.  She reports that the symptoms started after she had 4 beers.   Assessment & Plan:   Principal Problem:   DKA (diabetic ketoacidoses) (HCC) Active Problems:   Nausea & vomiting   DKA, type 2 (HCC)  Diabetes, DKA; Presents with elevated blood sugar, at 495, my car at 17, anion gap 17. She was admitted to the stepdown unit.  She was a started on insulin drip and IV fluids. Her gap has close, 8.  Bicarb 23. She was transitioned today to Lantus, and sliding scale insulin. Work-up for DKA: UA negative for infection.  Chest x-ray ordered.  Bronchitic changes, prominent interstitial marking Hb a1c at 12.   PNA,? Chest x ray with interstitial marking, bronchitic changes.  Will treat for PNA.  Check respiratory panel.  Start Azithromycin and ceftriaxone.  Check HIV screening.   Hypokalemia; replete orally.   Estimated body mass index is 31.96 kg/m as calculated from the following:   Height as of this encounter: 5' 4.96" (1.65 m).   Weight as of this encounter: 87 kg.   DVT prophylaxis: lovenox Code Status: full code.  Family Communication: Mother and father at bedside.  Disposition Plan: home when stable.   Consultants:   none   Procedures:  none   Antimicrobials: ceftriaxone  Azithromycin.    Subjective: She denies dysuria, feeling ok.  Denies abdominal pain, nausea.   Objective: Vitals:   01/26/18 0220 01/26/18 0331 01/26/18 0600 01/26/18 0800  BP: 121/62 132/68 116/68   Pulse: (!) 108 (!) 104 99   Resp: 18 18 (!) 22   Temp:  98.4 F (36.9 C)  98.7 F (37.1 C)  TempSrc:  Oral  Oral  SpO2: 95%  94%   Weight:  87  kg    Height:  5' 4.96" (1.65 m)      Intake/Output Summary (Last 24 hours) at 01/26/2018 1146 Last data filed at 01/26/2018 0124 Gross per 24 hour  Intake 999.99 ml  Output -  Net 999.99 ml   Filed Weights   01/26/18 0331  Weight: 87 kg    Examination:  General exam: Appears calm and comfortable  Respiratory system: Clear to auscultation. Respiratory effort normal. Cardiovascular system: S1 & S2 heard, RRR. No JVD, murmurs, rubs, gallops or clicks. No pedal edema. Gastrointestinal system: Abdomen is nondistended, soft and nontender. No organomegaly or masses felt. Normal bowel sounds heard. Central nervous system: Alert and oriented. No focal neurological deficits. Extremities: Symmetric 5 x 5 power. Skin: No rashes, lesions or ulcers Psychiatry: Judgement and insight appear normal. Mood & affect appropriate.     Data Reviewed: I have personally reviewed following labs and imaging studies  CBC: Recent Labs  Lab 01/25/18 2341 01/26/18 0328  WBC 15.2* 15.9*  NEUTROABS 11.6*  --   HGB 14.0 14.6  HCT 45.7 47.5*  MCV 90.1 91.3  PLT 318 316   Basic Metabolic Panel: Recent Labs  Lab 01/25/18 2341 01/26/18 0328 01/26/18 0801  NA 137 141 141  K 4.1 4.1 2.9*  CL 103 108 110  CO2 17* 13* 23  GLUCOSE 467* 322* 115*  BUN 14 13 14  CREATININE 0.81 0.58 0.67  CALCIUM 9.2 9.4 8.7*   GFR: Estimated Creatinine Clearance: 119 mL/min (by C-G formula based on SCr of 0.67 mg/dL). Liver Function Tests: No results for input(s): AST, ALT, ALKPHOS, BILITOT, PROT, ALBUMIN in the last 168 hours. No results for input(s): LIPASE, AMYLASE in the last 168 hours. No results for input(s): AMMONIA in the last 168 hours. Coagulation Profile: No results for input(s): INR, PROTIME in the last 168 hours. Cardiac Enzymes: No results for input(s): CKTOTAL, CKMB, CKMBINDEX, TROPONINI in the last 168 hours. BNP (last 3 results) No results for input(s): PROBNP in the last 8760  hours. HbA1C: Recent Labs    01/26/18 0338  HGBA1C 12.8*   CBG: Recent Labs  Lab 01/26/18 0629 01/26/18 0722 01/26/18 0840 01/26/18 0903 01/26/18 1011  GLUCAP 208* 166* 66* 139* 206*   Lipid Profile: No results for input(s): CHOL, HDL, LDLCALC, TRIG, CHOLHDL, LDLDIRECT in the last 72 hours. Thyroid Function Tests: No results for input(s): TSH, T4TOTAL, FREET4, T3FREE, THYROIDAB in the last 72 hours. Anemia Panel: No results for input(s): VITAMINB12, FOLATE, FERRITIN, TIBC, IRON, RETICCTPCT in the last 72 hours. Sepsis Labs: No results for input(s): PROCALCITON, LATICACIDVEN in the last 168 hours.  Recent Results (from the past 240 hour(s))  MRSA PCR Screening     Status: None   Collection Time: 01/26/18  3:31 AM  Result Value Ref Range Status   MRSA by PCR NEGATIVE NEGATIVE Final    Comment:        The GeneXpert MRSA Assay (FDA approved for NASAL specimens only), is one component of a comprehensive MRSA colonization surveillance program. It is not intended to diagnose MRSA infection nor to guide or monitor treatment for MRSA infections. Performed at [facility], [address] [place], [place] [address]          Radiology Studies: Dg Chest Port 1 View  Result Date: 01/26/2018 CLINICAL DATA:  Shortness of breath. Hyperglycemia. EXAM: PORTABLE CHEST 1 VIEW COMPARISON:  01/22/2009. FINDINGS: Poor inspiration. Normal sized heart. Stable mild peribronchial thickening with interval mild diffuse prominence of the interstitial markings. Normal vascularity. No fluid. Unremarkable bones. IMPRESSION: Stable mild chronic bronchitic changes with interval mild diffuse prominence of the interstitial markings. This is nonspecific with possible causes including inflammation and viral pneumonitis. This does not have the typical appearance of interstitial pulmonary edema. Electronically Signed   By: Beckie Salts M.D.   On: 01/26/2018 09:13        Scheduled  Meds: . azithromycin  500 mg Oral Daily  . enoxaparin (LOVENOX) injection  40 mg Subcutaneous Q24H  . insulin aspart  0-5 Units Subcutaneous QHS  . insulin aspart  0-9 Units Subcutaneous TID WC  . [START ON 01/27/2018] insulin glargine  15 Units Subcutaneous Daily  . insulin glargine  5 Units Subcutaneous Once   Continuous Infusions: . sodium chloride 100 mL/hr at 01/26/18 1126  . potassium chloride 10 mEq (01/26/18 1125)     LOS: 0 days    Time spent: 35 minutes.     Alba Cory, MD [facility]   If 7PM-7AM, please contact night-coverage [email or web address] Password [facility] 01/26/2018, 11:46 AM

## 2018-01-27 DIAGNOSIS — E0810 Diabetes mellitus due to underlying condition with ketoacidosis without coma: Secondary | ICD-10-CM | POA: Diagnosis not present

## 2018-01-27 LAB — BASIC METABOLIC PANEL
Anion gap: 9 (ref 5–15)
BUN: 20 mg/dL (ref 6–20)
CO2: 20 mmol/L — ABNORMAL LOW (ref 22–32)
Calcium: 8.5 mg/dL — ABNORMAL LOW (ref 8.9–10.3)
Chloride: 105 mmol/L (ref 98–111)
Creatinine, Ser: 0.7 mg/dL (ref 0.44–1.00)
GFR calc Af Amer: >60 mL/min (ref >60)
GFR calc non Af Amer: >60 mL/min (ref >60)
Glucose, Bld: 308 mg/dL — ABNORMAL HIGH (ref 70–99)
POTASSIUM: 3.7 mmol/L (ref 3.5–5.1)
Sodium: 134 mmol/L — ABNORMAL LOW (ref 135–145)

## 2018-01-27 LAB — CBC
HCT: 42.3 % (ref 36.0–46.0)
Hemoglobin: 13.2 g/dL (ref 12.0–15.0)
MCH: 28 pg (ref 26.0–34.0)
MCHC: 31.2 g/dL (ref 30.0–36.0)
MCV: 89.6 fL (ref 80.0–100.0)
Platelets: 306 10*3/uL (ref 150–400)
RBC: 4.72 MIL/uL (ref 3.87–5.11)
RDW: 12.3 % (ref 11.5–15.5)
WBC: 11.2 10*3/uL — ABNORMAL HIGH (ref 4.0–10.5)
nRBC: 0 % (ref 0.0–0.2)

## 2018-01-27 LAB — HIV ANTIBODY (ROUTINE TESTING W REFLEX): HIV Screen 4th Generation wRfx: NONREACTIVE

## 2018-01-27 LAB — GLUCOSE, CAPILLARY
Glucose-Capillary: 273 mg/dL — ABNORMAL HIGH (ref 70–99)
Glucose-Capillary: 240 mg/dL — ABNORMAL HIGH (ref 70–99)

## 2018-01-27 MED ORDER — AMOXICILLIN-POT CLAVULANATE 875-125 MG PO TABS: 1.0000 | ORAL_TABLET | Freq: Two times a day (BID) | ORAL | 0 refills | Status: AC

## 2018-01-27 MED ORDER — INSULIN ASPART PROT & ASPART (70-30 MIX) 100 UNIT/ML ~~LOC~~ SUSP
16.0000 [IU] | Freq: Two times a day (BID) | SUBCUTANEOUS | Status: DC
  Administered 2018-01-27: 16 [IU] via SUBCUTANEOUS
  Filled 2018-01-27: qty 10

## 2018-01-27 MED ORDER — INSULIN ASPART PROT & ASPART (70-30 MIX) 100 UNIT/ML PEN: 16.0000 [IU] | PEN_INJECTOR | Freq: Two times a day (BID) | SUBCUTANEOUS | 11 refills | Status: DC

## 2018-01-27 MED ORDER — AZITHROMYCIN 500 MG PO TABS: 500.0000 mg | ORAL_TABLET | Freq: Every day | ORAL | 0 refills | Status: AC

## 2018-01-27 MED ORDER — BLOOD GLUCOSE METER KIT: PACK | 0 refills | Status: AC

## 2018-01-27 MED ORDER — INSULIN GLARGINE 100 UNIT/ML ~~LOC~~ SOLN
25.0000 [IU] | Freq: Every day | SUBCUTANEOUS | Status: DC
  Filled 2018-01-27: qty 0.25

## 2018-01-27 MED ORDER — INSULIN GLARGINE 100 UNIT/ML ~~LOC~~ SOLN: 20.0000 [IU] | Freq: Every day | SUBCUTANEOUS | Status: DC

## 2018-01-27 NOTE — Progress Notes (Signed)
01/27/2018  @ 1000  Reviewed discharge instructions with patient and her mother. Both verbalized understanding of discharge instructions. Patient given a copy of discharge instructions, prescriptions, and work note.

## 2018-01-27 NOTE — Plan of Care (Signed)
  Problem: Nutrition: Goal: Adequate nutrition will be maintained Outcome: Progressing   Problem: Pain Managment: Goal: General experience of comfort will improve Outcome: Progressing   Problem: Safety: Goal: Ability to remain free from injury will improve Outcome: Progressing   

## 2018-01-27 NOTE — Discharge Summary (Signed)
Physician Discharge Summary  Amanda Kent [id number]:[id number] DOB: May 02, 1995 DOA: 01/25/2018  PCP: Patient, No Pcp Per  Admit date: 01/25/2018 Discharge date: 01/27/2018  Admitted From: Home  Disposition: Home   Recommendations for Outpatient Follow-up:  1. Follow up with PCP in 1-2 weeks 2. Please obtain BMP/CBC in one week 3. Needs to get repeat chest x ray to document resolution of infiltrates.  4. Needs further adjustment of insulin regimen.   Home Health: none  Discharge Condition: stable.  CODE STATUS: full code.  Diet recommendation: Carb Modified  Brief/Interim Summary: 23 year old with past medical history for diabetes type 2 on metformin and glipizide presents to the ER complaining of nausea and vomiting.  She has been having the symptoms for the last 24 hours prior to admission.  She reports that the symptoms started after she had 4 beers.  Diabetes, DKA; Presents with elevated blood sugar, at 495, my car at 17, anion gap 17. She was admitted to the stepdown unit.  She was started on insulin drip and IV fluids. Her gap has close, 8.  Bicarb 23. She was transitioned today to Lantus, and sliding scale insulin. She will be discharge on 70/30, 16 units BID. She will be able to afford 70/30. Mother will get her a PCP.  Work-up for DKA: UA negative for infection.  Chest x-ray ordered.  Bronchitic changes, prominent interstitial marking Hb a1c at 12.   PNA,? Chest x ray with interstitial marking, bronchitic changes.  Will treat for PNA.  respiratory panel negative Started on  Azithromycin and ceftriaxone. Received one dose IV. She will be discharge on azithromycin and augmentin for 5 days. WBC has decreased to 11 from 15.  HIV negative.   Hypokalemia; replete orally.  resolved.   Estimated body mass index is 31.96 kg/m as calculated from the following:   Discharge Diagnoses:  Principal Problem:   DKA (diabetic ketoacidoses) ([place]) Active Problems:   Nausea &  vomiting   DKA, type 2 [facility])    Discharge Instructions  Discharge Instructions    Ambulatory referral to Nutrition and Diabetic Education   Complete by:  As directed    Diet Carb Modified   Complete by:  As directed    Increase activity slowly   Complete by:  As directed      Allergies as of 01/27/2018   No Known Allergies     Medication List    STOP taking these medications   benzonatate 100 MG capsule Commonly known as:  TESSALON   glipiZIDE 5 MG tablet Commonly known as:  GLUCOTROL   metFORMIN 1000 MG tablet Commonly known as:  GLUCOPHAGE     TAKE these medications   amoxicillin-clavulanate 875-125 MG tablet Commonly known as:  AUGMENTIN Take 1 tablet by mouth 2 (two) times daily for 5 days.   azithromycin 500 MG tablet Commonly known as:  ZITHROMAX Take 1 tablet (500 mg total) by mouth daily for 5 days.   blood glucose meter kit and supplies Dispense based on patient and insurance preference. Use up to four times daily as directed. (FOR ICD-10 E10.9, E11.9).   insulin aspart protamine - aspart (70-30) 100 UNIT/ML FlexPen Commonly known as:  NOVOLOG 70/30 MIX Inject 0.16 mLs (16 Units total) into the skin 2 (two) times daily.   medroxyPROGESTERone 150 MG/ML injection Commonly known as:  DEPO-PROVERA ADMINISTER 1 ML(150 MG) IN THE MUSCLE EVERY 3 MONTHS What changed:    how much to take  how to take this  when  to take this  additional instructions       No Known Allergies  Consultations: none  Procedures/Studies: Dg Chest Port 1 View  Result Date: 01/26/2018 CLINICAL DATA:  Shortness of breath. Hyperglycemia. EXAM: PORTABLE CHEST 1 VIEW COMPARISON:  01/22/2009. FINDINGS: Poor inspiration. Normal sized heart. Stable mild peribronchial thickening with interval mild diffuse prominence of the interstitial markings. Normal vascularity. No fluid. Unremarkable bones. IMPRESSION: Stable mild chronic bronchitic changes with interval mild diffuse  prominence of the interstitial markings. This is nonspecific with possible causes including inflammation and viral pneumonitis. This does not have the typical appearance of interstitial pulmonary edema. Electronically Signed   By: Claudie Revering M.D.   On: 01/26/2018 09:13      Subjective: She is doing well. No complaints.    Discharge Exam: Vitals:   01/27/18 0356 01/27/18 0400  BP:    Pulse:  73  Resp:  16  Temp: 98 F (36.7 C)   SpO2:  97%   Vitals:   01/26/18 2320 01/27/18 0350 01/27/18 0356 01/27/18 0400  BP:  139/89    Pulse:    73  Resp:    16  Temp: 98 F (36.7 C)  98 F (36.7 C)   TempSrc: Axillary  Oral   SpO2:    97%  Weight:      Height:        General: Pt is alert, awake, not in acute distress Cardiovascular: RRR, S1/S2 +, no rubs, no gallops Respiratory: CTA bilaterally, no wheezing, no rhonchi Abdominal: Soft, NT, ND, bowel sounds + Extremities: no edema, no cyanosis    The results of significant diagnostics from this hospitalization (including imaging, microbiology, ancillary and laboratory) are listed below for reference.     Microbiology: Recent Results (from the past 240 hour(s))  MRSA PCR Screening     Status: None   Collection Time: 01/26/18  3:31 AM  Result Value Ref Range Status   MRSA by PCR NEGATIVE NEGATIVE Final    Comment:        The GeneXpert MRSA Assay (FDA approved for NASAL specimens only), is one component of a comprehensive MRSA colonization surveillance program. It is not intended to diagnose MRSA infection nor to guide or monitor treatment for MRSA infections. Performed at [facility], [place] [address] [place], [place] [address]   Respiratory Panel by PCR     Status: None   Collection Time: 01/26/18  1:18 PM  Result Value Ref Range Status   Adenovirus NOT DETECTED NOT DETECTED Final   Coronavirus 229E NOT DETECTED NOT DETECTED Final   Coronavirus HKU1 NOT DETECTED NOT DETECTED Final   Coronavirus  NL63 NOT DETECTED NOT DETECTED Final   Coronavirus OC43 NOT DETECTED NOT DETECTED Final   Metapneumovirus NOT DETECTED NOT DETECTED Final   Rhinovirus / Enterovirus NOT DETECTED NOT DETECTED Final   Influenza A NOT DETECTED NOT DETECTED Final   Influenza B NOT DETECTED NOT DETECTED Final   Parainfluenza Virus 1 NOT DETECTED NOT DETECTED Final   Parainfluenza Virus 2 NOT DETECTED NOT DETECTED Final   Parainfluenza Virus 3 NOT DETECTED NOT DETECTED Final   Parainfluenza Virus 4 NOT DETECTED NOT DETECTED Final   Respiratory Syncytial Virus NOT DETECTED NOT DETECTED Final   Bordetella pertussis NOT DETECTED NOT DETECTED Final   Chlamydophila pneumoniae NOT DETECTED NOT DETECTED Final   Mycoplasma pneumoniae NOT DETECTED NOT DETECTED Final    Comment: Performed at [facility], [place],  [place] [address]     Labs: BNP (last 3 results) No results for input(s): BNP in the last 8760 hours. Basic Metabolic Panel: Recent Labs  Lab 01/25/18 2341 01/26/18 0328 01/26/18 0801 01/26/18 1221 01/27/18 0651  NA 137 141 141 140 134*  K 4.1 4.1 2.9* 4.1 3.7  CL 103 108 110 110 105  CO2 17* 13* 23 21* 20*  GLUCOSE 467* 322* 115* 184* 308*  BUN _0 CREATININE 0.81 0.58 0.67 0.60 0.70  CALCIUM 9.2 9.4 8.7* 8.7* 8.5*   Liver Function Tests: Recent Labs  Lab 01/26/18 1221  AST 12*  ALT 13  ALKPHOS 50  BILITOT 0.6  PROT 6.8  ALBUMIN 3.7   Recent Labs  Lab 01/26/18 1221  LIPASE 40   No results for input(s): AMMONIA in the last 168 hours. CBC: Recent Labs  Lab 01/25/18 2341 01/26/18 0328 01/27/18 0651  WBC 15.2* 15.9* 11.2*  NEUTROABS 11.6*  --   --   HGB 14.0 14.6 13.2  HCT 45.7 47.5* 42.3  MCV 90.1 91.3 89.6  PLT 318 316 306   Cardiac Enzymes: No results for input(s): CKTOTAL, CKMB, CKMBINDEX, TROPONINI in the last 168 hours. BNP: Invalid input(s): POCBNP CBG: Recent Labs  Lab 01/26/18 1011 01/26/18 1142 01/26/18 1705 01/26/18 2125  01/27/18 0603  GLUCAP 206* 191* 302* 250* 273*   D-Dimer No results for input(s): DDIMER in the last 72 hours. Hgb A1c Recent Labs    01/26/18 0338  HGBA1C 12.8*   Lipid Profile No results for input(s): CHOL, HDL, LDLCALC, TRIG, CHOLHDL, LDLDIRECT in the last 72 hours. Thyroid function studies Recent Labs    01/26/18 1221  TSH 0.742   Anemia work up No results for input(s): VITAMINB12, FOLATE, FERRITIN, TIBC, IRON, RETICCTPCT in the last 72 hours. Urinalysis    Component Value Date/Time   COLORURINE STRAW (A) 01/25/2018 2341   APPEARANCEUR CLEAR 01/25/2018 2341   LABSPEC 1.030 01/25/2018 2341   PHURINE 5.0 01/25/2018 2341   GLUCOSEU >=500 (A) 01/25/2018 2341   HGBUR NEGATIVE 01/25/2018 2341   BILIRUBINUR NEGATIVE 01/25/2018 2341   BILIRUBINUR neg 07/26/2013 1249   KETONESUR 20 (A) 01/25/2018 2341   PROTEINUR NEGATIVE 01/25/2018 2341   UROBILINOGEN 1.0 07/26/2013 1249   UROBILINOGEN 1.0 07/24/2012 1852   NITRITE NEGATIVE 01/25/2018 2341   LEUKOCYTESUR NEGATIVE 01/25/2018 2341   Sepsis Labs Invalid input(s): PROCALCITONIN,  WBC,  LACTICIDVEN Microbiology Recent Results (from the past 240 hour(s))  MRSA PCR Screening     Status: None   Collection Time: 01/26/18  3:31 AM  Result Value Ref Range Status   MRSA by PCR NEGATIVE NEGATIVE Final    Comment:        The GeneXpert MRSA Assay (FDA approved for NASAL specimens only), is one component of a comprehensive MRSA colonization surveillance program. It is not intended to diagnose MRSA infection nor to guide or monitor treatment for MRSA infections. Performed at [facility], [place] [address], [place], [place] [address]   Respiratory Panel by PCR     Status: None   Collection Time: 01/26/18  1:18 PM  Result Value Ref Range Status   Adenovirus NOT DETECTED NOT DETECTED Final   Coronavirus 229E NOT DETECTED NOT DETECTED Final   Coronavirus HKU1 NOT DETECTED NOT DETECTED Final   Coronavirus NL63  NOT DETECTED NOT DETECTED Final   Coronavirus OC43 NOT DETECTED NOT DETECTED Final   Metapneumovirus NOT DETECTED NOT DETECTED Final   Rhinovirus /  Enterovirus NOT DETECTED NOT DETECTED Final   Influenza A NOT DETECTED NOT DETECTED Final   Influenza B NOT DETECTED NOT DETECTED Final   Parainfluenza Virus 1 NOT DETECTED NOT DETECTED Final   Parainfluenza Virus 2 NOT DETECTED NOT DETECTED Final   Parainfluenza Virus 3 NOT DETECTED NOT DETECTED Final   Parainfluenza Virus 4 NOT DETECTED NOT DETECTED Final   Respiratory Syncytial Virus NOT DETECTED NOT DETECTED Final   Bordetella pertussis NOT DETECTED NOT DETECTED Final   Chlamydophila pneumoniae NOT DETECTED NOT DETECTED Final   Mycoplasma pneumoniae NOT DETECTED NOT DETECTED Final    Comment: Performed at [facility], [place] [address], [place], [place] [address]     Time coordinating discharge: 35 minutes.   SIGNED:   Elmarie Shiley, MD  [facility] 01/27/2018, 8:16 AM   If 7PM-7AM, please contact night-coverage [email or web address] Password [email or web address]

## 2018-01-27 NOTE — Care Management Note (Signed)
Case Management Note  Patient Details  Name: Amanda Kent MRN: [id number] Date of Birth: 12-07-1995  Subjective/Objective:                  discharged  Action/Plan: Discharged to home with self-care, orders checked for hhc needs. No CM needs present at time of discharge.  Patient is able to arrangement own appointments and home care.  Expected Discharge Date:  01/27/18               Expected Discharge Plan:  Home/Self Care  In-House Referral:     Discharge planning Services     Post Acute Care Choice:    Choice offered to:     DME Arranged:    DME Agency:     HH Arranged:    HH Agency:     Status of Service:  Completed, signed off  If discussed at [employer] of Stay Meetings, dates discussed:    Additional Comments:  Golda Acre, RN 01/27/2018, 8:56 AM

## 2018-02-03 ENCOUNTER — Inpatient Hospital Stay: Payer: BLUE CROSS/BLUE SHIELD | Admitting: Emergency Medicine

## 2018-02-05 ENCOUNTER — Ambulatory Visit (INDEPENDENT_AMBULATORY_CARE_PROVIDER_SITE_OTHER): Payer: BLUE CROSS/BLUE SHIELD | Admitting: Family Medicine

## 2018-02-05 ENCOUNTER — Other Ambulatory Visit: Payer: Self-pay

## 2018-02-05 ENCOUNTER — Encounter: Payer: Self-pay | Admitting: Family Medicine

## 2018-02-05 VITALS — BP 120/75 | HR 77 | Temp 98.6°F | Ht 65.0 in | Wt 199.0 lb

## 2018-02-05 DIAGNOSIS — E1110 Type 2 diabetes mellitus with ketoacidosis without coma: Secondary | ICD-10-CM

## 2018-02-05 DIAGNOSIS — E1165 Type 2 diabetes mellitus with hyperglycemia: Secondary | ICD-10-CM | POA: Diagnosis not present

## 2018-02-05 DIAGNOSIS — D72829 Elevated white blood cell count, unspecified: Secondary | ICD-10-CM | POA: Diagnosis not present

## 2018-02-05 LAB — GLUCOSE, POCT (MANUAL RESULT ENTRY): POC Glucose: 102 mg/dl — AB (ref 70–99)

## 2018-02-05 NOTE — Progress Notes (Signed)
Subjective:    Patient ID: Amanda Kent, female    DOB: Mar 13, 1995, 23 y.o.   MRN: [id number]  HPI Amanda Kent is a 23 y.o. female Presents today for: Chief Complaint  Patient presents with  . Hospitalization Follow-up    in hospital last week   . Diabetes    f/u   . Diabetic Ketoacidosis   Diabetes: Type 2 diabetes complicated with hyperglycemia, recent diabetic ketoacidosis. Last seen in our office December 12 to establish care.  Was taking metformin 1000 mg twice daily, but reportedly had been off metformin for 4 weeks, restarting it 4 weeks prior to that visit. Glucose 293 in office on December 12, A1c 13.8.  Glipizide was added.  Admitted January 19 through June 21 with diabetic ketoacidosis, initially presented to ER complaining of nausea and vomiting.  Noted after 4 beers.  Blood sugar 495, bicarb 17, anion gap 17.  Admitted to stepdown, started on insulin drip and IV fluids.  Gap closed, bicarb increased to 23.  Transition to Lantus, with sliding scale insulin, ultimately discharged on 70/30 insulin 16 units twice daily for cost.  Had negative UA for infection, chest x-ray with with bronchitic changes, prominent interstitial markings, treated with azithromycin for possible pneumonia.  Respiratory panel was negative.  She was started on ceftriaxone and azithromycin.  Discharged on azithromycin and Augmentin for 5 days.  White blood cell decreased to 11 from 15.  HIV was nonreactive.  Hypokalemia was repleted orally which resolved in the hospital.  A1c 12 at hospital.    Recommended follow-up issues of repeat chest x-ray, BMP, CBC, and further adjustments to insulin   Microalbumin: Optho, foot exam, pneumovax:  Lab Results  Component Value Date   HGBA1C 12.8 (H) 01/26/2018   HGBA1C 13.8 (A) 12/18/2017   HGBA1C 7.1 11/21/2015   Lab Results  Component Value Date   MICROALBUR 1.5 11/21/2015   [place] 99 11/21/2015   CREATININE 0.70 01/27/2018      Patient  Active Problem List   Diagnosis Date Noted  . DKA (diabetic ketoacidoses) ([place]) 01/26/2018  . Nausea & vomiting 01/26/2018  . DKA, type 2 ([place]) 01/26/2018  . LGSIL on Pap smear of cervix 03/26/2017  . Type 2 diabetes mellitus without complication, without long-term current use of insulin ([place]) 02/07/2015  . BMI 32.0-32.9,adult 02/07/2015   Past Medical History:  Diagnosis Date  . Allergy   . Asthma   . Diabetes mellitus without complication [facility])    Past Surgical History:  Procedure Laterality Date  . osteomax2    . OSTEOTOMY     No Known Allergies Prior to Admission medications   Medication Sig Start Date End Date Taking? Authorizing Provider  blood glucose meter kit and supplies Dispense based on patient and insurance preference. Use up to four times daily as directed. (FOR ICD-10 E10.9, E11.9). 01/27/18  Yes Regalado, Belkys A, MD  insulin aspart protamine - aspart (NOVOLOG 70/30 MIX) (70-30) 100 UNIT/ML FlexPen Inject 0.16 mLs (16 Units total) into the skin 2 (two) times daily. 01/27/18  Yes Regalado, Belkys A, MD  medroxyPROGESTERone (DEPO-PROVERA) 150 MG/ML injection ADMINISTER 1 ML(150 MG) IN THE MUSCLE EVERY 3 MONTHS Patient taking differently: Inject 150 mg into the muscle every 3 (three) months.  11/26/17  Yes Shelly Bombard, MD   Social History   Socioeconomic History  . Marital status: Single    Spouse name: Not on file  . Number of children: Not on file  . Years  of education: Not on file  . Highest education level: Not on file  Occupational History  . Not on file  Social Needs  . Financial resource strain: Not on file  . Food insecurity:    Worry: Not on file    Inability: Not on file  . Transportation needs:    Medical: Not on file    Non-medical: Not on file  Tobacco Use  . Smoking status: Never Smoker  . Smokeless tobacco: Never Used  Substance and Sexual Activity  . Alcohol use: No    Alcohol/week: 0.0 standard drinks  . Drug use: No  . Sexual  activity: Yes    Birth control/protection: Injection  Lifestyle  . Physical activity:    Days per week: Not on file    Minutes per session: Not on file  . Stress: Not on file  Relationships  . Social connections:    Talks on phone: Not on file    Gets together: Not on file    Attends religious service: Not on file    Active member of club or organization: Not on file    Attends meetings of clubs or organizations: Not on file    Relationship status: Not on file  . Intimate partner violence:    Fear of current or ex partner: Not on file    Emotionally abused: Not on file    Physically abused: Not on file    Forced sexual activity: Not on file  Other Topics Concern  . Not on file  Social History Narrative  . Not on file    Review of Systems     Objective:   Physical Exam Vitals signs reviewed.  Constitutional:      Appearance: She is well-developed.  HENT:     Head: Normocephalic and atraumatic.  Eyes:     Conjunctiva/sclera: Conjunctivae normal.     Pupils: Pupils are equal, round, and reactive to light.  Neck:     Vascular: No carotid bruit.  Cardiovascular:     Rate and Rhythm: Normal rate and regular rhythm.     Heart sounds: Normal heart sounds.  Pulmonary:     Effort: Pulmonary effort is normal.     Breath sounds: Normal breath sounds.  Abdominal:     Palpations: Abdomen is soft. There is no pulsatile mass.     Tenderness: There is no abdominal tenderness.  Skin:    General: Skin is warm and dry.  Neurological:     Mental Status: She is alert and oriented to person, place, and time.  Psychiatric:        Behavior: Behavior normal.    Vitals:   02/05/18 1609  BP: 120/75  Pulse: 77  Temp: 98.6 F (37 C)  TempSrc: Oral  SpO2: 98%  Weight: 199 lb (90.3 kg)  Height: '5\' 5"'$  (1.651 m)     Results for orders placed or performed in visit on 02/05/18  POCT glucose (manual entry)  Result Value Ref Range   POC Glucose 102 (A) 70 - 99 mg/dl         Assessment & Plan:   Amanda Kent is a 23 y.o. female Type 2 diabetes mellitus with hyperglycemia, without long-term current use of insulin ([place]) - Plan: POCT glucose (manual entry), Basic metabolic panel  DKA, type 2, not at goal [facility]) - Plan: POCT glucose (manual entry), Basic metabolic panel, CBC  Leukocytosis, unspecified type - Plan: CBC  Tolerating 70/30 insulin at current doses.  Reports  some higher readings at home but in office testing was good.   -Did discuss risk of hypoglycemia and precautions given as well as handout.  RTC precautions if those symptoms occur.    -No change in regimen for now, but check home readings with fasting and 2-hour postprandials to bring to follow-up visit in the next 2 weeks.    -Plan for diabetic education, call if no contact in the next 2 weeks.  -Previous suspected pneumonia, clinically improved after completion of antibiotics.  Deferred repeat imaging for a few weeks to make sure clearing of previous abnormality.  RTC precautions if new/worsening symptoms.  No orders of the defined types were placed in this encounter.  Patient Instructions      Blood sugar today is 102.  I would not recommend any further increases in the 70/30 insulin at this time, and if anything watch for hypoglycemia or low blood sugars.  See information below on possible symptoms.  Make sure you carry some form of sugar in case that were to occur.  If you are having hypoglycemia/low blood sugars we need to change the dose of insulin.   Please record your home blood sugar readings either fasting or 2 hours after meals, 1-2 readings per day, and bring those with you to the next visit in 2 weeks.  If you have not heard from the diabetic education class within the next week, let us know and we can check on the status of that appointment.  I would recommend repeating a chest x-ray, but can do that in 3 to 4 weeks.  Thank you for coming in today.   Hypoglycemia Hypoglycemia  occurs when the level of sugar (glucose) in the blood is too low. Hypoglycemia can happen in people who do or do not have diabetes. It can develop quickly, and it can be a medical emergency. For most people with diabetes, a blood glucose level below 70 mg/dL (3.9 mmol/L) is considered hypoglycemia. Glucose is a type of sugar that provides the body's main source of energy. Certain hormones (insulin and glucagon) control the level of glucose in the blood. Insulin lowers blood glucose, and glucagon raises blood glucose. Hypoglycemia can result from having too much insulin in the bloodstream, or from not eating enough food that contains glucose. You may also have reactive hypoglycemia, which happens within 4 hours after eating a meal. What are the causes? Hypoglycemia occurs most often in people who have diabetes and may be caused by:  Diabetes medicine.  Not eating enough, or not eating often enough.  Increased physical activity.  Drinking alcohol on an empty stomach. If you do not have diabetes, hypoglycemia may be caused by:  A tumor in the pancreas.  Not eating enough, or not eating for long periods at a time (fasting).  A severe infection or illness.  Certain medicines. What increases the risk? Hypoglycemia is more likely to develop in:  People who have diabetes and take medicines to lower blood glucose.  People who abuse alcohol.  People who have a severe illness. What are the signs or symptoms? Mild symptoms Mild hypoglycemia may not cause any symptoms. If you do have symptoms, they may include:  Hunger.  Anxiety.  Sweating and feeling clammy.  Dizziness or feeling light-headed.  Sleepiness.  Nausea.  Increased heart rate.  Headache.  Blurry vision.  Irritability.  Tingling or numbness around the mouth, lips, or tongue.  A change in coordination.  Restless sleep. Moderate symptoms Moderate hypoglycemia can cause:  Mental confusion and poor  judgment.  Behavior changes.  Weakness.  Irregular heartbeat. Severe symptoms Severe hypoglycemia is a medical emergency. It can cause:  Fainting.  Seizures.  Loss of consciousness (coma).  Death. How is this diagnosed? Hypoglycemia is diagnosed with a blood test to measure your blood glucose level. This blood test is done while you are having symptoms. Your health care provider may also do a physical exam and review your medical history. How is this treated? This condition can often be treated by immediately eating or drinking something that contains sugar, such as:  Fruit juice, 4-6 oz (120-150 mL).  Regular soda (not diet soda), 4-6 oz (120-150 mL).  Low-fat milk, 4 oz (120 mL).  Several pieces of hard candy.  Sugar or honey, 1 Tbsp (15 mL). Treating hypoglycemia if you have diabetes If you are alert and able to swallow safely, follow the 15:15 rule:  Take 15 grams of a rapid-acting carbohydrate. Talk with your health care provider about how much you should take.  Rapid-acting options include: ? Glucose pills (take 15 grams). ? 6-8 pieces of hard candy. ? 4-6 oz (120-150 mL) of fruit juice. ? 4-6 oz (120-150 mL) of regular (not diet) soda. ? 1 Tbsp (15 mL) honey or sugar.  Check your blood glucose 15 minutes after you take the carbohydrate.  If the repeat blood glucose level is still at or below 70 mg/dL (3.9 mmol/L), take 15 grams of a carbohydrate again.  If your blood glucose level does not increase above 70 mg/dL (3.9 mmol/L) after 3 tries, seek emergency medical care.  After your blood glucose level returns to normal, eat a meal or a snack within 1 hour.  Treating severe hypoglycemia Severe hypoglycemia is when your blood glucose level is at or below 54 mg/dL (3 mmol/L). Severe hypoglycemia is a medical emergency. Get medical help right away. If you have severe hypoglycemia and you cannot eat or drink, you may need an injection of glucagon. A family member  or close friend should learn how to check your blood glucose and how to give you a glucagon injection. Ask your health care provider if you need to have an emergency glucagon injection kit available. Severe hypoglycemia may need to be treated in a hospital. The treatment may include getting glucose through an IV. You may also need treatment for the cause of your hypoglycemia. Follow these instructions at home:  General instructions  Take over-the-counter and prescription medicines only as told by your health care provider.  Monitor your blood glucose as told by your health care provider.  Limit alcohol intake to no more than 1 drink a day for nonpregnant women and 2 drinks a day for men. One drink equals 12 oz of beer (355 mL), 5 oz of wine (148 mL), or 1 oz of hard liquor (44 mL).  Keep all follow-up visits as told by your health care provider. This is important. If you have diabetes:  Always have a rapid-acting carbohydrate snack with you to treat low blood glucose.  Follow your diabetes management plan as directed. Make sure you: ? Know the symptoms of hypoglycemia. It is important to treat it right away to prevent it from becoming severe. ? Take your medicines as directed. ? Follow your exercise plan. ? Follow your meal plan. Eat on time, and do not skip meals. ? Check your blood glucose as often as directed. Always check before and after exercise. ? Follow your sick day plan whenever you  cannot eat or drink normally. Make this plan in advance with your health care provider.  Share your diabetes management plan with people in your workplace, school, and household.  Check your urine for ketones when you are ill and as told by your health care provider.  Carry a medical alert card or wear medical alert jewelry. Contact a health care provider if:  You have problems keeping your blood glucose in your target range.  You have frequent episodes of hypoglycemia. Get help right away  if:  You continue to have hypoglycemia symptoms after eating or drinking something containing glucose.  Your blood glucose is at or below 54 mg/dL (3 mmol/L).  You have a seizure.  You faint. These symptoms may represent a serious problem that is an emergency. Do not wait to see if the symptoms will go away. Get medical help right away. Call your local emergency services (911 in the [place] Summary  Hypoglycemia occurs when the level of sugar (glucose) in the blood is too low.  Hypoglycemia can happen in people who do or do not have diabetes. It can develop quickly, and it can be a medical emergency.  Make sure you know the symptoms of hypoglycemia and how to treat it.  Always have a rapid-acting carbohydrate snack with you to treat low blood sugar. This information is not intended to replace advice given to you by your health care provider. Make sure you discuss any questions you have with your health care provider. Document Released: 12/24/2004 Document Revised: 06/17/2017 Document Reviewed: 01/27/2015 Elsevier Interactive Patient Education  [employer].    If you have lab work done today you will be contacted with your lab results within the next 2 weeks.  If you have not heard from [place] then please contact us. The fastest way to get your results is to register for My Chart.   IF you received an x-ray today, you will receive an invoice from [facility]. Please contact [facility] at [phone] with questions or concerns regarding your invoice.   IF you received labwork today, you will receive an invoice from [facility]. Please contact [facility] at [phone] with questions or concerns regarding your invoice.   Our billing staff will not be able to assist you with questions regarding bills from these companies.  You will be contacted with the lab results as soon as they are available. The fastest way to get your results is to activate your My Chart account.  Instructions are located on the last page of this paperwork. If you have not heard from [place] regarding the results in 2 weeks, please contact this office.       Signed,   [name], MD [facility].  02/07/18 10:57 PM

## 2018-02-05 NOTE — Patient Instructions (Addendum)
Blood sugar today is 102.  I would not recommend any further increases in the 70/30 insulin at this time, and if anything watch for hypoglycemia or low blood sugars.  See information below on possible symptoms.  Make sure you carry some form of sugar in case that were to occur.  If you are having hypoglycemia/low blood sugars we need to change the dose of insulin.   Please record your home blood sugar readings either fasting or 2 hours after meals, 1-2 readings per day, and bring those with you to the next visit in 2 weeks.  If you have not heard from the diabetic education class within the next week, let us know and we can check on the status of that appointment.  I would recommend repeating a chest x-ray, but can do that in 3 to 4 weeks.  Thank you for coming in today.   Hypoglycemia Hypoglycemia occurs when the level of sugar (glucose) in the blood is too low. Hypoglycemia can happen in people who do or do not have diabetes. It can develop quickly, and it can be a medical emergency. For most people with diabetes, a blood glucose level below 70 mg/dL (3.9 mmol/L) is considered hypoglycemia. Glucose is a type of sugar that provides the body's main source of energy. Certain hormones (insulin and glucagon) control the level of glucose in the blood. Insulin lowers blood glucose, and glucagon raises blood glucose. Hypoglycemia can result from having too much insulin in the bloodstream, or from not eating enough food that contains glucose. You may also have reactive hypoglycemia, which happens within 4 hours after eating a meal. What are the causes? Hypoglycemia occurs most often in people who have diabetes and may be caused by:  Diabetes medicine.  Not eating enough, or not eating often enough.  Increased physical activity.  Drinking alcohol on an empty stomach. If you do not have diabetes, hypoglycemia may be caused by:  A tumor in the pancreas.  Not eating enough, or not eating for long  periods at a time (fasting).  A severe infection or illness.  Certain medicines. What increases the risk? Hypoglycemia is more likely to develop in:  People who have diabetes and take medicines to lower blood glucose.  People who abuse alcohol.  People who have a severe illness. What are the signs or symptoms? Mild symptoms Mild hypoglycemia may not cause any symptoms. If you do have symptoms, they may include:  Hunger.  Anxiety.  Sweating and feeling clammy.  Dizziness or feeling light-headed.  Sleepiness.  Nausea.  Increased heart rate.  Headache.  Blurry vision.  Irritability.  Tingling or numbness around the mouth, lips, or tongue.  A change in coordination.  Restless sleep. Moderate symptoms Moderate hypoglycemia can cause:  Mental confusion and poor judgment.  Behavior changes.  Weakness.  Irregular heartbeat. Severe symptoms Severe hypoglycemia is a medical emergency. It can cause:  Fainting.  Seizures.  Loss of consciousness (coma).  Death. How is this diagnosed? Hypoglycemia is diagnosed with a blood test to measure your blood glucose level. This blood test is done while you are having symptoms. Your health care provider may also do a physical exam and review your medical history. How is this treated? This condition can often be treated by immediately eating or drinking something that contains sugar, such as:  Fruit juice, 4-6 oz (120-150 mL).  Regular soda (not diet soda), 4-6 oz (120-150 mL).  Low-fat milk, 4 oz (120 mL).  Several  pieces of hard candy.  Sugar or honey, 1 Tbsp (15 mL). Treating hypoglycemia if you have diabetes If you are alert and able to swallow safely, follow the 15:15 rule:  Take 15 grams of a rapid-acting carbohydrate. Talk with your health care provider about how much you should take.  Rapid-acting options include: ? Glucose pills (take 15 grams). ? 6-8 pieces of hard candy. ? 4-6 oz (120-150 mL) of  fruit juice. ? 4-6 oz (120-150 mL) of regular (not diet) soda. ? 1 Tbsp (15 mL) honey or sugar.  Check your blood glucose 15 minutes after you take the carbohydrate.  If the repeat blood glucose level is still at or below 70 mg/dL (3.9 mmol/L), take 15 grams of a carbohydrate again.  If your blood glucose level does not increase above 70 mg/dL (3.9 mmol/L) after 3 tries, seek emergency medical care.  After your blood glucose level returns to normal, eat a meal or a snack within 1 hour.  Treating severe hypoglycemia Severe hypoglycemia is when your blood glucose level is at or below 54 mg/dL (3 mmol/L). Severe hypoglycemia is a medical emergency. Get medical help right away. If you have severe hypoglycemia and you cannot eat or drink, you may need an injection of glucagon. A family member or close friend should learn how to check your blood glucose and how to give you a glucagon injection. Ask your health care provider if you need to have an emergency glucagon injection kit available. Severe hypoglycemia may need to be treated in a hospital. The treatment may include getting glucose through an IV. You may also need treatment for the cause of your hypoglycemia. Follow these instructions at home:  General instructions  Take over-the-counter and prescription medicines only as told by your health care provider.  Monitor your blood glucose as told by your health care provider.  Limit alcohol intake to no more than 1 drink a day for nonpregnant women and 2 drinks a day for men. One drink equals 12 oz of beer (355 mL), 5 oz of wine (148 mL), or 1 oz of hard liquor (44 mL).  Keep all follow-up visits as told by your health care provider. This is important. If you have diabetes:  Always have a rapid-acting carbohydrate snack with you to treat low blood glucose.  Follow your diabetes management plan as directed. Make sure you: ? Know the symptoms of hypoglycemia. It is important to treat it  right away to prevent it from becoming severe. ? Take your medicines as directed. ? Follow your exercise plan. ? Follow your meal plan. Eat on time, and do not skip meals. ? Check your blood glucose as often as directed. Always check before and after exercise. ? Follow your sick day plan whenever you cannot eat or drink normally. Make this plan in advance with your health care provider.  Share your diabetes management plan with people in your workplace, school, and household.  Check your urine for ketones when you are ill and as told by your health care provider.  Carry a medical alert card or wear medical alert jewelry. Contact a health care provider if:  You have problems keeping your blood glucose in your target range.  You have frequent episodes of hypoglycemia. Get help right away if:  You continue to have hypoglycemia symptoms after eating or drinking something containing glucose.  Your blood glucose is at or below 54 mg/dL (3 mmol/L).  You have a seizure.  You faint. These symptoms  may represent a serious problem that is an emergency. Do not wait to see if the symptoms will go away. Get medical help right away. Call your local emergency services (911 in the [place] Summary  Hypoglycemia occurs when the level of sugar (glucose) in the blood is too low.  Hypoglycemia can happen in people who do or do not have diabetes. It can develop quickly, and it can be a medical emergency.  Make sure you know the symptoms of hypoglycemia and how to treat it.  Always have a rapid-acting carbohydrate snack with you to treat low blood sugar. This information is not intended to replace advice given to you by your health care provider. Make sure you discuss any questions you have with your health care provider. Document Released: 12/24/2004 Document Revised: 06/17/2017 Document Reviewed: 01/27/2015 Elsevier Interactive Patient Education  [employer].    If you have lab work done  today you will be contacted with your lab results within the next 2 weeks.  If you have not heard from [place] then please contact us. The fastest way to get your results is to register for My Chart.   IF you received an x-ray today, you will receive an invoice from [facility]. Please contact [facility] at [phone] with questions or concerns regarding your invoice.   IF you received labwork today, you will receive an invoice from [facility]. Please contact [facility] at [phone] with questions or concerns regarding your invoice.   Our billing staff will not be able to assist you with questions regarding bills from these companies.  You will be contacted with the lab results as soon as they are available. The fastest way to get your results is to activate your My Chart account. Instructions are located on the last page of this paperwork. If you have not heard from [place] regarding the results in 2 weeks, please contact this office.

## 2018-02-06 ENCOUNTER — Encounter (HOSPITAL_COMMUNITY): Payer: Self-pay

## 2018-02-06 ENCOUNTER — Other Ambulatory Visit: Payer: Self-pay

## 2018-02-06 ENCOUNTER — Emergency Department (HOSPITAL_COMMUNITY): Payer: BLUE CROSS/BLUE SHIELD

## 2018-02-06 ENCOUNTER — Emergency Department (HOSPITAL_COMMUNITY)
Admission: EM | Admit: 2018-02-06 | Discharge: 2018-02-06 | Disposition: A | Discharge status: Home / Self Care | Payer: BLUE CROSS/BLUE SHIELD | Attending: Emergency Medicine | Admitting: Emergency Medicine

## 2018-02-06 DIAGNOSIS — Y939 Activity, unspecified: Secondary | ICD-10-CM | POA: Insufficient documentation

## 2018-02-06 DIAGNOSIS — Y999 Unspecified external cause status: Secondary | ICD-10-CM | POA: Diagnosis not present

## 2018-02-06 DIAGNOSIS — S93402A Sprain of unspecified ligament of left ankle, initial encounter: Secondary | ICD-10-CM | POA: Insufficient documentation

## 2018-02-06 DIAGNOSIS — Y929 Unspecified place or not applicable: Secondary | ICD-10-CM | POA: Insufficient documentation

## 2018-02-06 DIAGNOSIS — S99912A Unspecified injury of left ankle, initial encounter: Secondary | ICD-10-CM | POA: Diagnosis present

## 2018-02-06 DIAGNOSIS — J45909 Unspecified asthma, uncomplicated: Secondary | ICD-10-CM | POA: Insufficient documentation

## 2018-02-06 DIAGNOSIS — S93401A Sprain of unspecified ligament of right ankle, initial encounter: Secondary | ICD-10-CM | POA: Diagnosis not present

## 2018-02-06 DIAGNOSIS — E119 Type 2 diabetes mellitus without complications: Secondary | ICD-10-CM | POA: Insufficient documentation

## 2018-02-06 DIAGNOSIS — W109XXA Fall (on) (from) unspecified stairs and steps, initial encounter: Secondary | ICD-10-CM | POA: Diagnosis not present

## 2018-02-06 LAB — CBC
Hematocrit: 42.1 % (ref 34.0–46.6)
Hemoglobin: 14.2 g/dL (ref 11.1–15.9)
MCH: 28.3 pg (ref 26.6–33.0)
MCHC: 33.7 g/dL (ref 31.5–35.7)
MCV: 84 fL (ref 79–97)
Platelets: 358 10*3/uL (ref 150–450)
RBC: 5.01 x10E6/uL (ref 3.77–5.28)
RDW: 11.8 % (ref 11.7–15.4)
WBC: 9.9 10*3/uL (ref 3.4–10.8)

## 2018-02-06 LAB — BASIC METABOLIC PANEL
BUN/Creatinine Ratio: 19 (ref 9–23)
BUN: 15 mg/dL (ref 6–20)
CO2: 18 mmol/L — ABNORMAL LOW (ref 20–29)
Calcium: 10 mg/dL (ref 8.7–10.2)
Chloride: 103 mmol/L (ref 96–106)
Creatinine, Ser: 0.81 mg/dL (ref 0.57–1.00)
GFR calc Af Amer: 118 mL/min/{1.73_m2} (ref >59)
GFR calc non Af Amer: 103 mL/min/{1.73_m2} (ref >59)
Glucose: 94 mg/dL (ref 65–99)
Potassium: 3.9 mmol/L (ref 3.5–5.2)
Sodium: 141 mmol/L (ref 134–144)

## 2018-02-06 MED ORDER — IBUPROFEN 800 MG PO TABS
800.0000 mg | ORAL_TABLET | Freq: Once | ORAL | Status: AC
  Administered 2018-02-06: 800 mg via ORAL
  Filled 2018-02-06: qty 1

## 2018-02-06 NOTE — Discharge Instructions (Addendum)
Take ibuprofen 3 times a day with meals. Take 600-800 mg at a time (3-4 pills). Do not take other anti-inflammatories at the same time (Advil, Motrin, naproxen, Aleve). You may supplement with Tylenol if you need further pain control. Use ice packs, 20 minutes at a time, 3 times a day for pain and swelling.  Use the braces as needed for pain and support.  Use crutches as needed for walking. After 2 days, limit your use of crutches.  If you pain is not improving in the next week, follow up with your primary care doctor.  Return to the ER with any new, worsening, or concerning symptoms.

## 2018-02-06 NOTE — ED Provider Notes (Signed)
[facility] DEPT Provider Note   CSN: [id number] Arrival date & time: 02/06/18  1937     History   Chief Complaint Chief Complaint  Patient presents with  . Ankle Injury    HPI Amanda Kent is a 23 y.o. female presenting for evaluation after fall.  Patient states she tripped going down the last 2 stairs around 8:00 this morning.  She reports she injured bilateral ankles, the right one is hurting her worse.  She denies hitting her head or loss of consciousness.  Denies injury elsewhere.  She is not on blood thinners.  She has not taking anything for pain including Tylenol or ibuprofen.  Pain is mild at rest, worse with ambulation and weightbearing. Nothing makes it better. She denies numbness or tingling.  She denies history of problems with her ankles.  HPI  Past Medical History:  Diagnosis Date  . Allergy   . Asthma   . Diabetes mellitus without complication [facility])     Patient Active Problem List   Diagnosis Date Noted  . DKA (diabetic ketoacidoses) ([place]) 01/26/2018  . Nausea & vomiting 01/26/2018  . DKA, type 2 ([place]) 01/26/2018  . LGSIL on Pap smear of cervix 03/26/2017  . Type 2 diabetes mellitus without complication, without long-term current use of insulin ([place]) 02/07/2015  . BMI 32.0-32.9,adult 02/07/2015    Past Surgical History:  Procedure Laterality Date  . osteomax2    . OSTEOTOMY       OB History    Gravida  0   Para  0   Term  0   Preterm  0   AB  0   Living  0     SAB  0   TAB  0   Ectopic  0   Multiple  0   Live Births  0            Home Medications    Prior to Admission medications   Medication Sig Start Date End Date Taking? Authorizing Provider  blood glucose meter kit and supplies Dispense based on patient and insurance preference. Use up to four times daily as directed. (FOR ICD-10 E10.9, E11.9). 01/27/18   Regalado, Belkys A, MD  insulin aspart protamine - aspart (NOVOLOG 70/30 MIX)  (70-30) 100 UNIT/ML FlexPen Inject 0.16 mLs (16 Units total) into the skin 2 (two) times daily. 01/27/18   Regalado, Belkys A, MD  medroxyPROGESTERone (DEPO-PROVERA) 150 MG/ML injection ADMINISTER 1 ML(150 MG) IN THE MUSCLE EVERY 3 MONTHS Patient taking differently: Inject 150 mg into the muscle every 3 (three) months.  11/26/17   Shelly Bombard, MD    Family History Family History  Problem Relation Age of Onset  . Diabetes Mother   . Heart disease Mother   . Hypertension Mother     Social History Social History   Tobacco Use  . Smoking status: Never Smoker  . Smokeless tobacco: Never Used  Substance Use Topics  . Alcohol use: No    Alcohol/week: 0.0 standard drinks  . Drug use: No     Allergies   Patient has no known allergies.   Review of Systems Review of Systems  Musculoskeletal: Positive for arthralgias.  Hematological: Does not bruise/bleed easily.     Physical Exam Updated Vital Signs BP 135/85 (BP Location: Left Arm)   Pulse 91   Temp 98.9 F (37.2 C)   Resp 16   Ht '5\' 5"'$  (1.651 m)   Wt 90.3 kg  SpO2 99%   BMI 33.12 kg/m   Physical Exam Vitals signs and nursing note reviewed.  Constitutional:      General: She is not in acute distress.    Appearance: She is well-developed.     Comments: Laying in the bed in NAD  HENT:     Head: Normocephalic and atraumatic.  Neck:     Musculoskeletal: Normal range of motion.  Pulmonary:     Effort: Pulmonary effort is normal.  Abdominal:     General: There is no distension.  Musculoskeletal:        General: Swelling and tenderness present.     Comments: Mild swelling of right lateral malleolus.  Tenderness palpation of the lateral malleolus bilaterally.  No obvious deformity.  Pedal pulses intact bilaterally.  No tenderness palpation elsewhere in the foot.  Decreased range of motion of bilateral ankles due to pain.  Achilles tendon palpable and intact bilaterally.  No tenderness palpation of lower leg  bilaterally. Sensation intact. Full active ROM of toes bilaterally.   Skin:    General: Skin is warm.     Capillary Refill: Capillary refill takes less than 2 seconds.     Findings: No rash.  Neurological:     Mental Status: She is alert and oriented to person, place, and time.      ED Treatments / Results  Labs (all labs ordered are listed, but only abnormal results are displayed) Labs Reviewed - No data to display  EKG None  Radiology Dg Ankle Complete Left  Result Date: 02/06/2018 CLINICAL DATA:  Ankle pain secondary to a fall outside at home today. EXAM: LEFT ANKLE COMPLETE - 3+ VIEW COMPARISON:  None. FINDINGS: There is no evidence of fracture, dislocation, or joint effusion. There is no evidence of arthropathy or other focal bone abnormality. Soft tissues are unremarkable. IMPRESSION: Negative. Electronically Signed   By: Lorriane Shire M.D.   On: 02/06/2018 10:51   Dg Ankle Complete Right  Result Date: 02/06/2018 CLINICAL DATA:  Right ankle pain secondary to a fall outside at home today. EXAM: RIGHT ANKLE - COMPLETE 3+ VIEW COMPARISON:  Radiographs dated 05/04/2004 FINDINGS: There is no evidence of fracture, dislocation, or joint effusion. There is no evidence of arthropathy or other focal bone abnormality. There is soft tissue swelling over the lateral malleolus. IMPRESSION: No bone abnormality. Soft tissue swelling at the lateral aspect of the ankle. Electronically Signed   By: Lorriane Shire M.D.   On: 02/06/2018 10:52    Procedures Procedures (including critical care time)  Medications Ordered in ED Medications  ibuprofen (ADVIL,MOTRIN) tablet 800 mg (800 mg Oral Given 02/06/18 1016)     Initial Impression / Assessment and Plan / ED Course  I have reviewed the triage vital signs and the nursing notes.  Pertinent labs & imaging results that were available during my care of the patient were reviewed by me and considered in my medical decision making (see chart for  details).     Pt presenting for evaluation of bilateral ankle pain after fall today.  Physical exam reassuring, she is neurovascularly intact.  Exam is consistent with ankle sprain.  Discussed with patient and mom, who initially did not want x-rays.  Plan for ASO, crutches for symptomatic control, Tylenol/ibuprofen, follow-up with PCP as needed.  Patient then decided she would like x-rays, because she is concerned there is something worse.  As such, x-rays ordered.  X-rays viewed interpreted by me, no fracture or dislocation. Will continue with  plan for symptomatic tx and f/u with PCP. At this time, pt appears safe for d/c. Return precautions given. Pt states she understands and agrees to plan.   Final Clinical Impressions(s) / ED Diagnoses   Final diagnoses:  Sprain of left ankle, unspecified ligament, initial encounter  Sprain of right ankle, unspecified ligament, initial encounter    ED Discharge Orders    None       Franchot Heidelberg, PA-C 02/06/18 1156    Gareth Morgan, MD 02/06/18 2221

## 2018-02-06 NOTE — ED Triage Notes (Signed)
Pt complains of bilateral ankle pain. Pt states she tripped down two/ three steps and twisted both ankles.  Pt states she did not lose consciousness and denies hitting her head.

## 2018-02-19 ENCOUNTER — Ambulatory Visit (INDEPENDENT_AMBULATORY_CARE_PROVIDER_SITE_OTHER): Payer: BLUE CROSS/BLUE SHIELD | Admitting: Family Medicine

## 2018-02-19 ENCOUNTER — Encounter: Payer: Self-pay | Admitting: Family Medicine

## 2018-02-19 ENCOUNTER — Other Ambulatory Visit: Payer: Self-pay

## 2018-02-19 VITALS — BP 130/81 | HR 82 | Temp 99.2°F | Resp 14 | Ht 65.0 in | Wt 199.2 lb

## 2018-02-19 DIAGNOSIS — L989 Disorder of the skin and subcutaneous tissue, unspecified: Secondary | ICD-10-CM | POA: Diagnosis not present

## 2018-02-19 DIAGNOSIS — E1165 Type 2 diabetes mellitus with hyperglycemia: Secondary | ICD-10-CM

## 2018-02-19 DIAGNOSIS — Z794 Long term (current) use of insulin: Secondary | ICD-10-CM

## 2018-02-19 LAB — GLUCOSE, POCT (MANUAL RESULT ENTRY): POC Glucose: 226 mg/dl — AB (ref 70–99)

## 2018-02-19 NOTE — Progress Notes (Signed)
Subjective:    Patient ID: Amanda Kent, female    DOB: [birth year], 23 y.o.   MRN: [id number]  HPI Amanda Kent is a 23 y.o. female Presents today for: Chief Complaint  Patient presents with  . Diabetes    2 wk follow up on diabetes, last seen here 02/05/18 was told to keep bs reading at home at last visit. Blood sugar was 352 yday. Did not check bs nat home this am   Here for follow-up of type 2 diabetes, admitted January 19-21 with diabetic ketoacidosis.  Ultimately was transitioned to 70/30 insulin 16 units twice daily due to cost.  Had been treated with azithromycin for possible pneumonia when hospitalized.  I saw her in follow-up January 30.  Glucose 102 in the office that day.  She was tolerating insulin at that time but had reported some higher readings at home.  We decided to avoid changes to 70/30 insulin given reading in office.  Plan for follow-up with diabetic education.    Glucose 94 at office visit on January 30, bicarb borderline at 18 but was feeling well that day.  Feeling ok. No n/v/abd pain. No blurry vision.  No headache. Able to work.  No new cough/fever.  Still using 16units of 70/30 insulin BID. No missed doses.  Home readings: Usually upper 100's, lowest 120. Higher yesterday - 352, then 340 yesterday.  Did have a piece of chocolate, but also sausage on bread. Felt tired at the time. Feels better today.  Has not checked blood sugar today - only checking a few times per week.  Has not met with diabetic education yet.   Bump on R hand since yesterday. NKI.   Patient Active Problem List   Diagnosis Date Noted  . DKA (diabetic ketoacidoses) ([place]) 01/26/2018  . Nausea & vomiting 01/26/2018  . DKA, type 2 ([place]) 01/26/2018  . LGSIL on Pap smear of cervix 03/26/2017  . Type 2 diabetes mellitus without complication, without long-term current use of insulin ([place]) 02/07/2015  . BMI 32.0-32.9,adult 02/07/2015   Past Medical History:  Diagnosis Date  .  Allergy   . Asthma   . Diabetes mellitus without complication [facility])    Past Surgical History:  Procedure Laterality Date  . osteomax2    . OSTEOTOMY     No Known Allergies Prior to Admission medications   Medication Sig Start Date End Date Taking? Authorizing Provider  blood glucose meter kit and supplies Dispense based on patient and insurance preference. Use up to four times daily as directed. (FOR ICD-10 E10.9, E11.9). 01/27/18  Yes Regalado, Belkys A, MD  insulin aspart protamine - aspart (NOVOLOG 70/30 MIX) (70-30) 100 UNIT/ML FlexPen Inject 0.16 mLs (16 Units total) into the skin 2 (two) times daily. 01/27/18  Yes Regalado, Belkys A, MD  medroxyPROGESTERone (DEPO-PROVERA) 150 MG/ML injection ADMINISTER 1 ML(150 MG) IN THE MUSCLE EVERY 3 MONTHS Patient taking differently: Inject 150 mg into the muscle every 3 (three) months.  11/26/17  Yes Shelly Bombard, MD   Social History   Socioeconomic History  . Marital status: Single    Spouse name: Not on file  . Number of children: Not on file  . Years of education: Not on file  . Highest education level: Not on file  Occupational History  . Not on file  Social Needs  . Financial resource strain: Not on file  . Food insecurity:    Worry: Not on file    Inability: Not on  file  . Transportation needs:    Medical: Not on file    Non-medical: Not on file  Tobacco Use  . Smoking status: Never Smoker  . Smokeless tobacco: Never Used  Substance and Sexual Activity  . Alcohol use: No    Alcohol/week: 0.0 standard drinks  . Drug use: No  . Sexual activity: Yes    Birth control/protection: Injection  Lifestyle  . Physical activity:    Days per week: Not on file    Minutes per session: Not on file  . Stress: Not on file  Relationships  . Social connections:    Talks on phone: Not on file    Gets together: Not on file    Attends religious service: Not on file    Active member of club or organization: Not on file    Attends  meetings of clubs or organizations: Not on file    Relationship status: Not on file  . Intimate partner violence:    Fear of current or ex partner: Not on file    Emotionally abused: Not on file    Physically abused: Not on file    Forced sexual activity: Not on file  Other Topics Concern  . Not on file  Social History Narrative  . Not on file    Review of Systems Per HPI.     Objective:   Physical Exam Vitals signs reviewed.  Constitutional:      Appearance: She is well-developed.  HENT:     Head: Normocephalic and atraumatic.  Eyes:     Conjunctiva/sclera: Conjunctivae normal.     Pupils: Pupils are equal, round, and reactive to light.  Neck:     Vascular: No carotid bruit.  Cardiovascular:     Rate and Rhythm: Normal rate and regular rhythm.     Heart sounds: Normal heart sounds.  Pulmonary:     Effort: Pulmonary effort is normal.     Breath sounds: Normal breath sounds.  Abdominal:     Palpations: Abdomen is soft. There is no pulsatile mass.     Tenderness: There is no abdominal tenderness.  Skin:    General: Skin is warm and dry.       Neurological:     Mental Status: She is alert and oriented to person, place, and time.  Psychiatric:        Behavior: Behavior normal.    Vitals:   02/19/18 1153  BP: 130/81  Pulse: 82  Resp: 14  Temp: 99.2 F (37.3 C)  TempSrc: Oral  SpO2: 98%  Weight: 199 lb 3.2 oz (90.4 kg)  Height: '5\' 5"'$  (1.651 m)     Results for orders placed or performed in visit on 02/19/18  POCT glucose (manual entry)  Result Value Ref Range   POC Glucose 226 (A) 70 - 99 mg/dl      Assessment & Plan:   Amanda Kent is a 23 y.o. female Type 2 diabetes mellitus with hyperglycemia, with long-term current use of insulin ([place]) - Plan: POCT glucose (manual entry), Basic metabolic panel  -Still some elevated readings at home, denies true hypoglycemia.  Stressed importance of diet adherence and handout given, reading yesterday may have  been due to dietary indiscretion.  Still elevated today.  -Increase 70/30 insulin to 17 units twice per day with hypoglycemic precautions again discussed  -Discussed more frequent monitoring.  -We will check into diabetes educator evaluation  -Recheck 2 weeks.   Hand lesion  -Early callus/abrasion possible. No  known bite.   -Symptomatic care discussed with RTC precautions if increasing redness swelling or persistent symptoms.  No orders of the defined types were placed in this encounter.  Patient Instructions   Increase 70/30 insulin to 17 units twice per day.  Make sure you to check your blood sugars at least once per day and at any other time when you feel like your blood sugar may be running high or low.  See information below on diabetes and nutrition as the elevated readings yesterday may have been due to intake.  Please recheck with me in 2 weeks, sooner if needed.    Diabetes Mellitus and Nutrition, Adult When you have diabetes (diabetes mellitus), it is very important to have healthy eating habits because your blood sugar (glucose) levels are greatly affected by what you eat and drink. Eating healthy foods in the appropriate amounts, at about the same times every day, can help you:  Control your blood glucose.  Lower your risk of heart disease.  Improve your blood pressure.  Reach or maintain a healthy weight. Every person with diabetes is different, and each person has different needs for a meal plan. Your health care provider may recommend that you work with a diet and nutrition specialist (dietitian) to make a meal plan that is best for you. Your meal plan may vary depending on factors such as:  The calories you need.  The medicines you take.  Your weight.  Your blood glucose, blood pressure, and cholesterol levels.  Your activity level.  Other health conditions you have, such as heart or kidney disease. How do carbohydrates affect me? Carbohydrates, also called  carbs, affect your blood glucose level more than any other type of food. Eating carbs naturally raises the amount of glucose in your blood. Carb counting is a method for keeping track of how many carbs you eat. Counting carbs is important to keep your blood glucose at a healthy level, especially if you use insulin or take certain oral diabetes medicines. It is important to know how many carbs you can safely have in each meal. This is different for every person. Your dietitian can help you calculate how many carbs you should have at each meal and for each snack. Foods that contain carbs include:  Bread, cereal, rice, pasta, and crackers.  Potatoes and corn.  Peas, beans, and lentils.  Milk and yogurt.  Fruit and juice.  Desserts, such as cakes, cookies, ice cream, and candy. How does alcohol affect me? Alcohol can cause a sudden decrease in blood glucose (hypoglycemia), especially if you use insulin or take certain oral diabetes medicines. Hypoglycemia can be a life-threatening condition. Symptoms of hypoglycemia (sleepiness, dizziness, and confusion) are similar to symptoms of having too much alcohol. If your health care provider says that alcohol is safe for you, follow these guidelines:  Limit alcohol intake to no more than 1 drink per day for nonpregnant women and 2 drinks per day for men. One drink equals 12 oz of beer, 5 oz of wine, or 1 oz of hard liquor.  Do not drink on an empty stomach.  Keep yourself hydrated with water, diet soda, or unsweetened iced tea.  Keep in mind that regular soda, juice, and other mixers may contain a lot of sugar and must be counted as carbs. What are tips for following this plan?  Reading food labels  Start by checking the serving size on the "Nutrition Facts" label of packaged foods and drinks. The amount of  calories, carbs, fats, and other nutrients listed on the label is based on one serving of the item. Many items contain more than one serving  per package.  Check the total grams (g) of carbs in one serving. You can calculate the number of servings of carbs in one serving by dividing the total carbs by 15. For example, if a food has 30 g of total carbs, it would be equal to 2 servings of carbs.  Check the number of grams (g) of saturated and trans fats in one serving. Choose foods that have low or no amount of these fats.  Check the number of milligrams (mg) of salt (sodium) in one serving. Most people should limit total sodium intake to less than 2,300 mg per day.  Always check the nutrition information of foods labeled as "low-fat" or "nonfat". These foods may be higher in added sugar or refined carbs and should be avoided.  Talk to your dietitian to identify your daily goals for nutrients listed on the label. Shopping  Avoid buying canned, premade, or processed foods. These foods tend to be high in fat, sodium, and added sugar.  Shop around the outside edge of the grocery store. This includes fresh fruits and vegetables, bulk grains, fresh meats, and fresh dairy. Cooking  Use low-heat cooking methods, such as baking, instead of high-heat cooking methods like deep frying.  Cook using healthy oils, such as olive, canola, or sunflower oil.  Avoid cooking with butter, cream, or high-fat meats. Meal planning  Eat meals and snacks regularly, preferably at the same times every day. Avoid going long periods of time without eating.  Eat foods high in fiber, such as fresh fruits, vegetables, beans, and whole grains. Talk to your dietitian about how many servings of carbs you can eat at each meal.  Eat 4-6 ounces (oz) of lean protein each day, such as lean meat, chicken, fish, eggs, or tofu. One oz of lean protein is equal to: ? 1 oz of meat, chicken, or fish. ? 1 egg. ?  cup of tofu.  Eat some foods each day that contain healthy fats, such as avocado, nuts, seeds, and fish. Lifestyle  Check your blood glucose  regularly.  Exercise regularly as told by your health care provider. This may include: ? 150 minutes of moderate-intensity or vigorous-intensity exercise each week. This could be brisk walking, biking, or water aerobics. ? Stretching and doing strength exercises, such as yoga or weightlifting, at least 2 times a week.  Take medicines as told by your health care provider.  Do not use any products that contain nicotine or tobacco, such as cigarettes and e-cigarettes. If you need help quitting, ask your health care provider.  Work with a Social worker or diabetes educator to identify strategies to manage stress and any emotional and social challenges. Questions to ask a health care provider  Do I need to meet with a diabetes educator?  Do I need to meet with a dietitian?  What number can I call if I have questions?  When are the best times to check my blood glucose? Where to find more information:  American Diabetes Association: diabetes.org  Academy of Nutrition and Dietetics: www.eatright.[employer] of Diabetes and Digestive and Kidney Diseases (NIH): [employer] Summary  A healthy meal plan will help you control your blood glucose and maintain a healthy lifestyle.  Working with a diet and nutrition specialist (dietitian) can help you make a meal plan that is best for you.  Keep in mind that carbohydrates (carbs) and alcohol have immediate effects on your blood glucose levels. It is important to count carbs and to use alcohol carefully. This information is not intended to replace advice given to you by your health care provider. Make sure you discuss any questions you have with your health care provider. Document Released: 09/20/2004 Document Revised: 07/24/2016 Document Reviewed: 01/29/2016 Elsevier Interactive Patient Education  [employer].   If you have lab work done today you will be contacted with your lab results within the next 2 weeks.  If you have  not heard from [place] then please contact us. The fastest way to get your results is to register for My Chart.   IF you received an x-ray today, you will receive an invoice from [facility]. Please contact [facility] at [phone] with questions or concerns regarding your invoice.   IF you received labwork today, you will receive an invoice from [place]. Please contact [facility] at [phone] with questions or concerns regarding your invoice.   Our billing staff will not be able to assist you with questions regarding bills from these companies.  You will be contacted with the lab results as soon as they are available. The fastest way to get your results is to activate your My Chart account. Instructions are located on the last page of this paperwork. If you have not heard from [place] regarding the results in 2 weeks, please contact this office.

## 2018-02-19 NOTE — Patient Instructions (Addendum)
Increase 70/30 insulin to 17 units twice per day.  Make sure you to check your blood sugars at least once per day and at any other time when you feel like your blood sugar may be running high or low.  See information below on diabetes and nutrition as the elevated readings yesterday may have been due to intake.  Please recheck with me in 2 weeks, sooner if needed.    Diabetes Mellitus and Nutrition, Adult When you have diabetes (diabetes mellitus), it is very important to have healthy eating habits because your blood sugar (glucose) levels are greatly affected by what you eat and drink. Eating healthy foods in the appropriate amounts, at about the same times every day, can help you:  Control your blood glucose.  Lower your risk of heart disease.  Improve your blood pressure.  Reach or maintain a healthy weight. Every person with diabetes is different, and each person has different needs for a meal plan. Your health care provider may recommend that you work with a diet and nutrition specialist (dietitian) to make a meal plan that is best for you. Your meal plan may vary depending on factors such as:  The calories you need.  The medicines you take.  Your weight.  Your blood glucose, blood pressure, and cholesterol levels.  Your activity level.  Other health conditions you have, such as heart or kidney disease. How do carbohydrates affect me? Carbohydrates, also called carbs, affect your blood glucose level more than any other type of food. Eating carbs naturally raises the amount of glucose in your blood. Carb counting is a method for keeping track of how many carbs you eat. Counting carbs is important to keep your blood glucose at a healthy level, especially if you use insulin or take certain oral diabetes medicines. It is important to know how many carbs you can safely have in each meal. This is different for every person. Your dietitian can help you calculate how many carbs you should  have at each meal and for each snack. Foods that contain carbs include:  Bread, cereal, rice, pasta, and crackers.  Potatoes and corn.  Peas, beans, and lentils.  Milk and yogurt.  Fruit and juice.  Desserts, such as cakes, cookies, ice cream, and candy. How does alcohol affect me? Alcohol can cause a sudden decrease in blood glucose (hypoglycemia), especially if you use insulin or take certain oral diabetes medicines. Hypoglycemia can be a life-threatening condition. Symptoms of hypoglycemia (sleepiness, dizziness, and confusion) are similar to symptoms of having too much alcohol. If your health care provider says that alcohol is safe for you, follow these guidelines:  Limit alcohol intake to no more than 1 drink per day for nonpregnant women and 2 drinks per day for men. One drink equals 12 oz of beer, 5 oz of wine, or 1 oz of hard liquor.  Do not drink on an empty stomach.  Keep yourself hydrated with water, diet soda, or unsweetened iced tea.  Keep in mind that regular soda, juice, and other mixers may contain a lot of sugar and must be counted as carbs. What are tips for following this plan?  Reading food labels  Start by checking the serving size on the "Nutrition Facts" label of packaged foods and drinks. The amount of calories, carbs, fats, and other nutrients listed on the label is based on one serving of the item. Many items contain more than one serving per package.  Check the total grams (g) of  carbs in one serving. You can calculate the number of servings of carbs in one serving by dividing the total carbs by 15. For example, if a food has 30 g of total carbs, it would be equal to 2 servings of carbs.  Check the number of grams (g) of saturated and trans fats in one serving. Choose foods that have low or no amount of these fats.  Check the number of milligrams (mg) of salt (sodium) in one serving. Most people should limit total sodium intake to less than 2,300 mg per  day.  Always check the nutrition information of foods labeled as "low-fat" or "nonfat". These foods may be higher in added sugar or refined carbs and should be avoided.  Talk to your dietitian to identify your daily goals for nutrients listed on the label. Shopping  Avoid buying canned, premade, or processed foods. These foods tend to be high in fat, sodium, and added sugar.  Shop around the outside edge of the grocery store. This includes fresh fruits and vegetables, bulk grains, fresh meats, and fresh dairy. Cooking  Use low-heat cooking methods, such as baking, instead of high-heat cooking methods like deep frying.  Cook using healthy oils, such as olive, canola, or sunflower oil.  Avoid cooking with butter, cream, or high-fat meats. Meal planning  Eat meals and snacks regularly, preferably at the same times every day. Avoid going long periods of time without eating.  Eat foods high in fiber, such as fresh fruits, vegetables, beans, and whole grains. Talk to your dietitian about how many servings of carbs you can eat at each meal.  Eat 4-6 ounces (oz) of lean protein each day, such as lean meat, chicken, fish, eggs, or tofu. One oz of lean protein is equal to: ? 1 oz of meat, chicken, or fish. ? 1 egg. ?  cup of tofu.  Eat some foods each day that contain healthy fats, such as avocado, nuts, seeds, and fish. Lifestyle  Check your blood glucose regularly.  Exercise regularly as told by your health care provider. This may include: ? 150 minutes of moderate-intensity or vigorous-intensity exercise each week. This could be brisk walking, biking, or water aerobics. ? Stretching and doing strength exercises, such as yoga or weightlifting, at least 2 times a week.  Take medicines as told by your health care provider.  Do not use any products that contain nicotine or tobacco, such as cigarettes and e-cigarettes. If you need help quitting, ask your health care provider.  Work with  a Social worker or diabetes educator to identify strategies to manage stress and any emotional and social challenges. Questions to ask a health care provider  Do I need to meet with a diabetes educator?  Do I need to meet with a dietitian?  What number can I call if I have questions?  When are the best times to check my blood glucose? Where to find more information:  American Diabetes Association: diabetes.org  Academy of Nutrition and Dietetics: www.eatright.[employer] of Diabetes and Digestive and Kidney Diseases (NIH): [employer] Summary  A healthy meal plan will help you control your blood glucose and maintain a healthy lifestyle.  Working with a diet and nutrition specialist (dietitian) can help you make a meal plan that is best for you.  Keep in mind that carbohydrates (carbs) and alcohol have immediate effects on your blood glucose levels. It is important to count carbs and to use alcohol carefully. This information is not intended to replace  advice given to you by your health care provider. Make sure you discuss any questions you have with your health care provider. Document Released: 09/20/2004 Document Revised: 07/24/2016 Document Reviewed: 01/29/2016 Elsevier Interactive Patient Education  [employer]2019 Elsevier Inc.   If you have lab work done today you will be contacted with your lab results within the next 2 weeks.  If you have not heard from us then please contact us. The fastest way to get your results is to register for My Chart.   IF you received an x-ray today, you will receive an invoice from [facility]. Please contact [facility] at [phone] with questions or concerns regarding your invoice.   IF you received labwork today, you will receive an invoice from [facility]. Please contact [facility] at [phone] with questions or concerns regarding your invoice.   Our billing staff will not be able to assist you with questions regarding  bills from these companies.  You will be contacted with the lab results as soon as they are available. The fastest way to get your results is to activate your My Chart account. Instructions are located on the last page of this paperwork. If you have not heard from us regarding the results in 2 weeks, please contact this office.

## 2018-02-20 ENCOUNTER — Encounter: Payer: Self-pay | Admitting: Emergency Medicine

## 2018-02-20 LAB — BASIC METABOLIC PANEL
BUN/Creatinine Ratio: 21 (ref 9–23)
BUN: 16 mg/dL (ref 6–20)
CHLORIDE: 105 mmol/L (ref 96–106)
CO2: 17 mmol/L — ABNORMAL LOW (ref 20–29)
Calcium: 9.5 mg/dL (ref 8.7–10.2)
Creatinine, Ser: 0.75 mg/dL (ref 0.57–1.00)
GFR calc Af Amer: 130 mL/min/{1.73_m2} (ref >59)
GFR calc non Af Amer: 113 mL/min/{1.73_m2} (ref >59)
Glucose: 191 mg/dL — ABNORMAL HIGH (ref 65–99)
Potassium: 4.3 mmol/L (ref 3.5–5.2)
Sodium: 142 mmol/L (ref 134–144)

## 2018-02-27 ENCOUNTER — Other Ambulatory Visit: Payer: Self-pay | Admitting: Family Medicine

## 2018-02-27 DIAGNOSIS — E878 Other disorders of electrolyte and fluid balance, not elsewhere classified: Secondary | ICD-10-CM

## 2018-02-27 NOTE — Progress Notes (Signed)
bm 

## 2018-03-04 ENCOUNTER — Telehealth: Payer: Self-pay | Admitting: Family Medicine

## 2018-03-04 NOTE — Telephone Encounter (Signed)
Called pt to cancel her appt for tomorrow bcbs [employer] out of network with her insurance . Left VM    FR

## 2018-03-05 ENCOUNTER — Ambulatory Visit: Payer: BLUE CROSS/BLUE SHIELD

## 2018-03-05 ENCOUNTER — Ambulatory Visit: Payer: BLUE CROSS/BLUE SHIELD | Admitting: Family Medicine

## 2018-03-11 ENCOUNTER — Ambulatory Visit (INDEPENDENT_AMBULATORY_CARE_PROVIDER_SITE_OTHER): Payer: BLUE CROSS/BLUE SHIELD

## 2018-03-11 DIAGNOSIS — Z3042 Encounter for surveillance of injectable contraceptive: Secondary | ICD-10-CM | POA: Diagnosis not present

## 2018-03-11 MED ORDER — MEDROXYPROGESTERONE ACETATE 150 MG/ML IM SUSP
150.0000 mg | INTRAMUSCULAR | Status: AC
  Administered 2018-03-11 – 2018-06-05 (×2): 150 mg via INTRAMUSCULAR

## 2018-03-11 NOTE — Progress Notes (Signed)
Patient is in the office for depo, administered in R del and pt tolerated well. Next depo due 5/20-6/3. Advised pt to schedule annual at checkout, pt left without scheduling. Called pt, left vm to call back and schedule appt. .. Administrations This Visit    medroxyPROGESTERone (DEPO-PROVERA) injection 150 mg    Admin Date 03/11/2018 Action Given Dose 150 mg Route Intramuscular Administered By Katrina Stack, RN

## 2018-03-11 NOTE — Progress Notes (Signed)
Agree with A & P. 

## 2018-03-12 ENCOUNTER — Ambulatory Visit: Payer: BLUE CROSS/BLUE SHIELD | Admitting: Emergency Medicine

## 2018-04-20 ENCOUNTER — Telehealth: Payer: Self-pay

## 2018-04-20 ENCOUNTER — Telehealth: Payer: Self-pay | Admitting: Family Medicine

## 2018-04-20 NOTE — Telephone Encounter (Signed)
Has appt tomorrow. Keep that appt.

## 2018-04-20 NOTE — Telephone Encounter (Signed)
Left message with mother for pt to call bk. She is needing help with how to take her insulin

## 2018-04-20 NOTE — Telephone Encounter (Signed)
Copied from CRM [id number]. Topic: Quick Communication - See Telephone Encounter >> Apr 20, 2018  1:09 PM Louie Bun, Rosey Bath D wrote: CRM for notification. See Telephone encounter for: 04/20/18. Patient called and would like to talk to Dr. Neva Seat about her insulin and how much unit she takes. Patient stated that her blood sugar is in high and is concern about that. Please call patient back, thanks.

## 2018-04-20 NOTE — Telephone Encounter (Signed)
Pt called with concerns of having high cbg's. She is taking her insulin as rx'd. She says the range is 270-300 however this is after she has had a  Meal. I informed pt that she should take before and after. She is also having frequency in urination. Want to know whether is to increase the insulin. She is making a telemed appt

## 2018-04-21 ENCOUNTER — Other Ambulatory Visit: Payer: Self-pay

## 2018-04-21 ENCOUNTER — Telehealth (INDEPENDENT_AMBULATORY_CARE_PROVIDER_SITE_OTHER): Payer: BLUE CROSS/BLUE SHIELD | Admitting: Family Medicine

## 2018-04-21 DIAGNOSIS — Z794 Long term (current) use of insulin: Secondary | ICD-10-CM

## 2018-04-21 DIAGNOSIS — E1165 Type 2 diabetes mellitus with hyperglycemia: Secondary | ICD-10-CM

## 2018-04-21 NOTE — Progress Notes (Signed)
CC-Discuss insulin- Patient would like to know she she increase her insulin since her glucose is reading high. Blood sugar today before meal was 198 then after meal ir was 240 today. Yesterday evening blood sugar was 350. I put in orders for patient to come  into the office 04/23/18 to have labs done. I ordered an  A1c and a comp. Please feel free to add any other labs you would like to add.

## 2018-04-21 NOTE — Patient Instructions (Addendum)
Increase insulin by 2 units each dose -total of 19 units twice per day.  Watch for any low blood sugar symptoms as the insulin dose increases.  See information below on low blood sugar precautions.  If at any point you have nausea, vomiting, or abdominal pain, those could be signs of diabetic ketoacidosis, and be seen in the emergency room.  Depending on blood work in 2 days, can discuss other changes.  For now we will plan on follow-up in 2 weeks by telemedicine again.  Let me know if there are questions in the meantime.   Hypoglycemia Hypoglycemia is when the sugar (glucose) level in your blood is too low. Signs of low blood sugar may include:  Feeling: ? Hungry. ? Worried or nervous (anxious). ? Sweaty and clammy. ? Confused. ? Dizzy. ? Sleepy. ? Sick to your stomach (nauseous).  Having: ? A fast heartbeat. ? A headache. ? A change in your vision. ? Tingling or no feeling (numbness) around your mouth, lips, or tongue. ? Jerky movements that you cannot control (seizure).  Having trouble with: ? Moving (coordination). ? Sleeping. ? Passing out (fainting). ? Getting upset easily (irritability). Low blood sugar can happen to people who have diabetes and people who do not have diabetes. Low blood sugar can happen quickly, and it can be an emergency. Treating low blood sugar Low blood sugar is often treated by eating or drinking something sugary right away, such as:  Fruit juice, 4-6 oz (120-150 mL).  Regular soda (not diet soda), 4-6 oz (120-150 mL).  Low-fat milk, 4 oz (120 mL).  Several pieces of hard candy.  Sugar or honey, 1 Tbsp (15 mL). Treating low blood sugar if you have diabetes If you can think clearly and swallow safely, follow the 15:15 rule:  Take 15 grams of a fast-acting carb (carbohydrate). Talk with your doctor about how much you should take.  Always keep a source of fast-acting carb with you, such as: ? Sugar tablets (glucose pills). Take 3-4  pills. ? 6-8 pieces of hard candy. ? 4-6 oz (120-150 mL) of fruit juice. ? 4-6 oz (120-150 mL) of regular (not diet) soda. ? 1 Tbsp (15 mL) honey or sugar.  Check your blood sugar 15 minutes after you take the carb.  If your blood sugar is still at or below 70 mg/dL (3.9 mmol/L), take 15 grams of a carb again.  If your blood sugar does not go above 70 mg/dL (3.9 mmol/L) after 3 tries, get help right away.  After your blood sugar goes back to normal, eat a meal or a snack within 1 hour.  Treating very low blood sugar If your blood sugar is at or below 54 mg/dL (3 mmol/L), you have very low blood sugar (severe hypoglycemia). This may also cause:  Passing out.  Jerky movements you cannot control (seizure).  Losing consciousness (coma). This is an emergency. Do not wait to see if the symptoms will go away. Get medical help right away. Call your local emergency services (911 in the [place] Do not drive yourself to the hospital. If you have very low blood sugar and you cannot eat or drink, you may need a glucagon shot (injection). A family member or friend should learn how to check your blood sugar and how to give you a glucagon shot. Ask your doctor if you need to have a glucagon shot kit at home. Follow these instructions at home: General instructions  Take over-the-counter and prescription medicines only as   told by your doctor.  Stay aware of your blood sugar as told by your doctor.  Limit alcohol intake to no more than 1 drink a day for nonpregnant women and 2 drinks a day for men. One drink equals 12 oz of beer (355 mL), 5 oz of wine (148 mL), or 1 oz of hard liquor (44 mL).  Keep all follow-up visits as told by your doctor. This is important. If you have diabetes:   Follow your diabetes care plan as told by your doctor. Make sure you: ? Know the signs of low blood sugar. ? Take your medicines as told. ? Follow your exercise and meal plan. ? Eat on time. Do not skip  meals. ? Check your blood sugar as often as told by your doctor. Always check it before and after exercise. ? Follow your sick day plan when you cannot eat or drink normally. Make this plan ahead of time with your doctor.  Share your diabetes care plan with: ? Your work or school. ? People you live with.  Check your pee (urine) for ketones: ? When you are sick. ? As told by your doctor.  Carry a card or wear jewelry that says you have diabetes. Contact a doctor if:  You have trouble keeping your blood sugar in your target range.  You have low blood sugar often. Get help right away if:  You still have symptoms after you eat or drink something sugary.  Your blood sugar is at or below 54 mg/dL (3 mmol/L).  You have jerky movements that you cannot control.  You pass out. These symptoms may be an emergency. Do not wait to see if the symptoms will go away. Get medical help right away. Call your local emergency services (911 in the [place] Do not drive yourself to the hospital. Summary  Hypoglycemia happens when the level of sugar (glucose) in your blood is too low.  Low blood sugar can happen to people who have diabetes and people who do not have diabetes. Low blood sugar can happen quickly, and it can be an emergency.  Make sure you know the signs of low blood sugar and know how to treat it.  Always keep a source of sugar (fast-acting carb) with you to treat low blood sugar. This information is not intended to replace advice given to you by your health care provider. Make sure you discuss any questions you have with your health care provider. Document Released: 03/20/2009 Document Revised: 06/17/2017 Document Reviewed: 01/27/2015 Elsevier Interactive Patient Education  [employer].    If you have lab work done today you will be contacted with your lab results within the next 2 weeks.  If you have not heard from [place] then please contact us. The fastest way to get your results  is to register for My Chart.   IF you received an x-ray today, you will receive an invoice from [facility]. Please contact [facility] at [phone] with questions or concerns regarding your invoice.   IF you received labwork today, you will receive an invoice from [place]. Please contact [facility] at [phone] with questions or concerns regarding your invoice.   Our billing staff will not be able to assist you with questions regarding bills from these companies.  You will be contacted with the lab results as soon as they are available. The fastest way to get your results is to activate your My Chart account. Instructions are located on the last page of this  paperwork. If you have not heard from us regarding the results in 2 weeks, please contact this office.      

## 2018-04-21 NOTE — Progress Notes (Signed)
Virtual Visit via Telephone Note 11:26 AM Called - left VM. Will try again.   I connected with Amanda Kent on 04/21/18 at 11:58 AM by telephone and verified that I am speaking with the correct person using two identifiers.   I discussed the limitations, risks, security and privacy concerns of performing an evaluation and management service by telephone and the availability of in person appointments. I also discussed with the patient that there may be a patient responsible charge related to this service. The patient expressed understanding and agreed to proceed, consent obtained  Chief complaint: Diabetes, hyperglycemia.   History of Present Illness: Diabetes: Insulin-dependent, complicated by hyperglycemia and previous DKA, admitted January 19 through January 21 of this year..  Last visit with me was February 13.  She was asked to follow-up in 2 weeks at that time.  Handout was provided on diabetes and nutrition.  70/30 insulin was increased to 17 units twice per day.  Denied true hypoglycemia at that time.  Of note her bicarbonate was 17 on February 13 office visit.  Had been 20 on January 28, 2016 on January 30.  Glucose 191 at that visit.  Recommended lab only visit to recheck bicarbonate but that has not been done.   Recently has noted increased blood sugar readings, fasting 198 this morning, 240 postprandial.  Yesterday evening blood sugar 350. Still on 17 units of 70/30 BID. Lowest 170. No symptomatic lows.  No nausea or vomiting. No abd pain, no HA, feels well overall. Frequent urination when readings elevated, but not today. Has labs planned in 2 days.  Eats 3 meals per day - not skipping meals.  Trying to adjust diet - trying to eat out less, cooking more for self, less fried food.  Microalbumin: due Optho, foot exam, pneumovax: up to date except pneumovax.    Lab Results  Component Value Date   HGBA1C 12.8 (H) 01/26/2018   HGBA1C 13.8 (A) 12/18/2017   HGBA1C 7.1  11/21/2015   Lab Results  Component Value Date   MICROALBUR 1.5 11/21/2015   [place] 99 11/21/2015   CREATININE 0.75 02/19/2018      Patient Active Problem List   Diagnosis Date Noted  . DKA (diabetic ketoacidoses) ([place]) 01/26/2018  . Nausea & vomiting 01/26/2018  . DKA, type 2 ([place]) 01/26/2018  . LGSIL on Pap smear of cervix 03/26/2017  . Type 2 diabetes mellitus without complication, without long-term current use of insulin ([place]) 02/07/2015  . BMI 32.0-32.9,adult 02/07/2015   Past Medical History:  Diagnosis Date  . Allergy   . Asthma   . Diabetes mellitus without complication [facility])    Past Surgical History:  Procedure Laterality Date  . osteomax2    . OSTEOTOMY     No Known Allergies Prior to Admission medications   Medication Sig Start Date End Date Taking? Authorizing Provider  blood glucose meter kit and supplies Dispense based on patient and insurance preference. Use up to four times daily as directed. (FOR ICD-10 E10.9, E11.9). 01/27/18  Yes Regalado, Belkys A, MD  insulin aspart protamine - aspart (NOVOLOG 70/30 MIX) (70-30) 100 UNIT/ML FlexPen Inject 0.16 mLs (16 Units total) into the skin 2 (two) times daily. 01/27/18  Yes Regalado, Belkys A, MD  medroxyPROGESTERone (DEPO-PROVERA) 150 MG/ML injection ADMINISTER 1 ML(150 MG) IN THE MUSCLE EVERY 3 MONTHS Patient taking differently: Inject 150 mg into the muscle every 3 (three) months.  11/26/17  Yes Shelly Bombard, MD   Social History  Socioeconomic History  . Marital status: Single    Spouse name: Not on file  . Number of children: Not on file  . Years of education: Not on file  . Highest education level: Not on file  Occupational History  . Not on file  Social Needs  . Financial resource strain: Not on file  . Food insecurity:    Worry: Not on file    Inability: Not on file  . Transportation needs:    Medical: Not on file    Non-medical: Not on file  Tobacco Use  . Smoking status: Never Smoker  .  Smokeless tobacco: Never Used  Substance and Sexual Activity  . Alcohol use: No    Alcohol/week: 0.0 standard drinks  . Drug use: No  . Sexual activity: Yes    Birth control/protection: Injection  Lifestyle  . Physical activity:    Days per week: Not on file    Minutes per session: Not on file  . Stress: Not on file  Relationships  . Social connections:    Talks on phone: Not on file    Gets together: Not on file    Attends religious service: Not on file    Active member of club or organization: Not on file    Attends meetings of clubs or organizations: Not on file    Relationship status: Not on file  . Intimate partner violence:    Fear of current or ex partner: Not on file    Emotionally abused: Not on file    Physically abused: Not on file    Forced sexual activity: Not on file  Other Topics Concern  . Not on file  Social History Narrative  . Not on file     Observations/Objective: Speaking normally on phone, no distress.  Normal responses.  Assessment and Plan: Type 2 diabetes mellitus with hyperglycemia, with long-term current use of insulin (HCC) - Plan: Comprehensive metabolic panel, Hemoglobin A1c   -Uncontrolled with hyperglycemia, prior DKA.  Now with persistent hyperglycemia.  Unfortunately has not followed up since February visit when planned 2-week follow-up.  Stressed importance of ongoing follow-up/close follow-up with prior DKA, understanding expressed.  -Labs planned this week.  For now we will increase 70/30 insulin to 19 units twice daily, hypoglycemia precautions discussed, continue to monitor diet with handout and guidance given last visit.  ER precautions if any nausea/vomiting, or symptoms of DKA reviewed.  -2 week telemedicine follow-up.   Follow Up Instructions:  2 weeks.    I discussed the assessment and treatment plan with the patient. The patient was provided an opportunity to ask questions and all were answered. The patient agreed with the  plan and demonstrated an understanding of the instructions.   The patient was advised to call back or seek an in-person evaluation if the symptoms worsen or if the condition fails to improve as anticipated.  I provided 10 minutes of non-face-to-face time during this encounter.  Signed,   [name], MD [facility] [place].  04/21/18

## 2018-04-23 ENCOUNTER — Other Ambulatory Visit: Payer: Self-pay

## 2018-04-23 ENCOUNTER — Ambulatory Visit (INDEPENDENT_AMBULATORY_CARE_PROVIDER_SITE_OTHER): Payer: BLUE CROSS/BLUE SHIELD | Admitting: Family Medicine

## 2018-04-23 DIAGNOSIS — Z794 Long term (current) use of insulin: Secondary | ICD-10-CM

## 2018-04-23 DIAGNOSIS — E1165 Type 2 diabetes mellitus with hyperglycemia: Secondary | ICD-10-CM | POA: Diagnosis not present

## 2018-04-23 DIAGNOSIS — E878 Other disorders of electrolyte and fluid balance, not elsewhere classified: Secondary | ICD-10-CM | POA: Diagnosis not present

## 2018-04-23 LAB — POCT URINALYSIS DIP (MANUAL ENTRY)
Bilirubin, UA: NEGATIVE
Blood, UA: NEGATIVE
Glucose, UA: 500 mg/dL — AB
Leukocytes, UA: NEGATIVE
Nitrite, UA: NEGATIVE
Protein Ur, POC: NEGATIVE mg/dL
Spec Grav, UA: 1.025 (ref 1.010–1.025)
Urobilinogen, UA: 0.2 E.U./dL
pH, UA: 6 (ref 5.0–8.0)

## 2018-04-23 LAB — COMPREHENSIVE METABOLIC PANEL
ALT: 11 IU/L (ref 0–32)
AST: 11 IU/L (ref 0–40)
Albumin/Globulin Ratio: 1.6 (ref 1.2–2.2)
Albumin: 4.4 g/dL (ref 3.9–5.0)
Alkaline Phosphatase: 65 IU/L (ref 39–117)
BUN/Creatinine Ratio: 18 (ref 9–23)
BUN: 14 mg/dL (ref 6–20)
Bilirubin Total: 0.4 mg/dL (ref 0.0–1.2)
CO2: 18 mmol/L — ABNORMAL LOW (ref 20–29)
Calcium: 9.3 mg/dL (ref 8.7–10.2)
Chloride: 104 mmol/L (ref 96–106)
Creatinine, Ser: 0.79 mg/dL (ref 0.57–1.00)
GFR calc Af Amer: 121 mL/min/{1.73_m2} (ref >59)
GFR calc non Af Amer: 105 mL/min/{1.73_m2} (ref >59)
Globulin, Total: 2.7 g/dL (ref 1.5–4.5)
Glucose: 302 mg/dL — ABNORMAL HIGH (ref 65–99)
Potassium: 4.4 mmol/L (ref 3.5–5.2)
Sodium: 140 mmol/L (ref 134–144)
Total Protein: 7.1 g/dL (ref 6.0–8.5)

## 2018-04-23 LAB — HEMOGLOBIN A1C
Est. average glucose Bld gHb Est-mCnc: 269 mg/dL
Hgb A1c MFr Bld: 11 % — ABNORMAL HIGH (ref 4.8–5.6)

## 2018-04-24 LAB — MICROALBUMIN / CREATININE URINE RATIO
Creatinine, Urine: 127.1 mg/dL
Microalb/Creat Ratio: 14 mg/g creat (ref 0–29)
Microalbumin, Urine: 17.4 ug/mL

## 2018-05-08 ENCOUNTER — Telehealth: Payer: BLUE CROSS/BLUE SHIELD | Admitting: Family Medicine

## 2018-05-08 ENCOUNTER — Other Ambulatory Visit: Payer: Self-pay

## 2018-05-28 ENCOUNTER — Ambulatory Visit: Payer: BLUE CROSS/BLUE SHIELD

## 2018-06-05 ENCOUNTER — Other Ambulatory Visit: Payer: Self-pay

## 2018-06-05 ENCOUNTER — Ambulatory Visit (INDEPENDENT_AMBULATORY_CARE_PROVIDER_SITE_OTHER): Payer: BLUE CROSS/BLUE SHIELD

## 2018-06-05 DIAGNOSIS — Z3042 Encounter for surveillance of injectable contraceptive: Secondary | ICD-10-CM

## 2018-06-05 NOTE — Progress Notes (Signed)
I have reviewed this chart and agree with the RN/CMA assessment and management.    K. Meryl Linzy Darling, M.D. Attending [facility]   

## 2018-06-05 NOTE — Progress Notes (Signed)
Nurse visit for Depo Pt is on time for injection Depo given LD w/o difficulty Next Depo due Aug 14-28, pt agrees.

## 2018-08-19 ENCOUNTER — Encounter: Payer: Self-pay | Admitting: Obstetrics

## 2018-08-19 ENCOUNTER — Ambulatory Visit (INDEPENDENT_AMBULATORY_CARE_PROVIDER_SITE_OTHER): Payer: BLUE CROSS/BLUE SHIELD | Admitting: Obstetrics

## 2018-08-19 DIAGNOSIS — Z3009 Encounter for other general counseling and advice on contraception: Secondary | ICD-10-CM

## 2018-08-19 DIAGNOSIS — N939 Abnormal uterine and vaginal bleeding, unspecified: Secondary | ICD-10-CM | POA: Diagnosis not present

## 2018-08-19 DIAGNOSIS — Z3042 Encounter for surveillance of injectable contraceptive: Secondary | ICD-10-CM

## 2018-08-19 DIAGNOSIS — Z30016 Encounter for initial prescription of transdermal patch hormonal contraceptive device: Secondary | ICD-10-CM

## 2018-08-19 MED ORDER — XULANE 150-35 MCG/24HR TD PTWK: 1.0000 | MEDICATED_PATCH | TRANSDERMAL | 12 refills | Status: DC

## 2018-08-19 NOTE — Progress Notes (Signed)
I connected with Amanda Kent on 08/19/18 at  2:00 PM EDT by telephone and verified that I am speaking with the correct person using two identifiers.  Pt c/o heavy periods. Depo use x 8 yrs.  Wishes to discuss changing BC - pt unsure which one.

## 2018-08-19 NOTE — Progress Notes (Signed)
    TELEHEALTH GYNECOLOGY VIRTUAL VIDEO VISIT ENCOUNTER NOTE  Provider location: [facility] at [place]   I connected with [employer] on 08/19/18 at  2:00 PM EDT by WebEx OB MyChart Video Encounter at home and verified that I am speaking with the correct person using two identifiers.   I discussed the limitations, risks, security and privacy concerns of performing an evaluation and management service virtually and the availability of in person appointments. I also discussed with the patient that there may be a patient responsible charge related to this service. The patient expressed understanding and agreed to proceed.   History:  Amanda Kent is a 23 y.o. G0P0000 female being evaluated today for contraceptive management.  Has been on Depo Provera injections for 8 years.  Periods have been very heavy over the past several months.  She would like to switch to a different contraceptive.  She denies any abnormal vaginal discharge, pelvic pain or other concerns.       Past Medical History:  Diagnosis Date  . Allergy   . Asthma   . Diabetes mellitus without complication [facility])    Past Surgical History:  Procedure Laterality Date  . osteomax2    . OSTEOTOMY     The following portions of the patient's history were reviewed and updated as appropriate: allergies, current medications, past family history, past medical history, past social history, past surgical history and problem list.   Health Maintenance:  Normal pap and negative HRHPV on 03-25-2017.    Review of Systems:  Pertinent items noted in HPI and remainder of comprehensive ROS otherwise negative.  Physical Exam:   General:  Alert, oriented and cooperative. Patient appears to be in no acute distress.  Mental Status: Normal mood and affect. Normal behavior. Normal judgment and thought content.   Respiratory: Normal respiratory effort, no problems with respiration noted  Rest of physical exam deferred  due to type of encounter  Labs and Imaging No results found for this or any previous visit (from the past 336 hour(s)). No results found.     Assessment and Plan:     1. Encounter for surveillance of injectable contraceptive  2. Abnormal uterine bleeding (AUB) - O  3. Encounter for other general counseling and advice on contraception - wants discontinue Depo and start the patch  4. Encounter for initial prescription of transdermal patch hormonal contraceptive device - Xulane patch Rx       I discussed the assessment and treatment plan with the patient. The patient was provided an opportunity to ask questions and all were answered. The patient agreed with the plan and demonstrated an understanding of the instructions.   The patient was advised to call back or seek an in-person evaluation/go to the ED if the symptoms worsen or if the condition fails to improve as anticipated.  I provided 10 minutes of face-to-face time during this encounter.   Baltazar Najjar, MD [facility] for [facility] 08-19-2018

## 2018-08-27 ENCOUNTER — Ambulatory Visit: Payer: BLUE CROSS/BLUE SHIELD

## 2020-02-02 ENCOUNTER — Ambulatory Visit (INDEPENDENT_AMBULATORY_CARE_PROVIDER_SITE_OTHER): Payer: BLUE CROSS/BLUE SHIELD | Admitting: Certified Nurse Midwife

## 2020-02-02 ENCOUNTER — Inpatient Hospital Stay (HOSPITAL_COMMUNITY): Admit: 2020-02-02 | Payer: BLUE CROSS/BLUE SHIELD

## 2020-02-02 ENCOUNTER — Encounter: Payer: Self-pay | Admitting: Certified Nurse Midwife

## 2020-02-02 ENCOUNTER — Other Ambulatory Visit: Payer: Self-pay

## 2020-02-02 VITALS — BP 128/87 | HR 102 | Ht 66.0 in | Wt 212.0 lb

## 2020-02-02 DIAGNOSIS — Z3009 Encounter for other general counseling and advice on contraception: Secondary | ICD-10-CM

## 2020-02-02 DIAGNOSIS — Z124 Encounter for screening for malignant neoplasm of cervix: Secondary | ICD-10-CM | POA: Diagnosis not present

## 2020-02-02 DIAGNOSIS — Z3202 Encounter for pregnancy test, result negative: Secondary | ICD-10-CM | POA: Diagnosis not present

## 2020-02-02 DIAGNOSIS — Z309 Encounter for contraceptive management, unspecified: Secondary | ICD-10-CM

## 2020-02-02 DIAGNOSIS — N9089 Other specified noninflammatory disorders of vulva and perineum: Secondary | ICD-10-CM

## 2020-02-02 DIAGNOSIS — Z3042 Encounter for surveillance of injectable contraceptive: Secondary | ICD-10-CM | POA: Diagnosis not present

## 2020-02-02 DIAGNOSIS — Z113 Encounter for screening for infections with a predominantly sexual mode of transmission: Secondary | ICD-10-CM

## 2020-02-02 DIAGNOSIS — Z01419 Encounter for gynecological examination (general) (routine) without abnormal findings: Secondary | ICD-10-CM

## 2020-02-02 LAB — POCT URINE PREGNANCY: Preg Test, Ur: NEGATIVE

## 2020-02-02 MED ORDER — MEDROXYPROGESTERONE ACETATE 150 MG/ML IM SUSP: 150.0000 mg | INTRAMUSCULAR | 2 refills | Status: DC

## 2020-02-02 MED ORDER — MEDROXYPROGESTERONE ACETATE 150 MG/ML IM SUSP
150.0000 mg | INTRAMUSCULAR | Status: AC
  Administered 2020-02-02 – 2020-04-26 (×2): 150 mg via INTRAMUSCULAR

## 2020-02-02 NOTE — Progress Notes (Signed)
Patient presents fro Annual Exam today.  LMP: 01/14/2020 lasting 5 days with Moderate flow.  Last pap: 03/24/2017 LSIL CIN1 + HPV   Contraception: None considering Depo per patient was on depo and stopped. *Last had unprotected intercourse x 2 wks ago.   STD Screening: Swab only   Family Hx of Breast Cancer: None   CC: None

## 2020-02-02 NOTE — Progress Notes (Addendum)
Gynecology Annual Exam   History of Present Illness: Amanda Kent is a 25 y.o. single female presenting for an annual exam and birth control. She complains of bump on her right labia, noticed 2 mos ago. Not painful, no redness or drainage. Denies hx of STDs. She is sexually active. No new partner. She denies dyspareunia. She does not perform self breast exams. There is no notable family history of breast or ovarian cancer in her family. Had unprotected IC 2 weeks ago. She would like to restart Depo.  Past Medical History:  Past Medical History:  Diagnosis Date  . Allergy   . Asthma   . Diabetes mellitus without complication [facility])     Past Surgical History:  Past Surgical History:  Procedure Laterality Date  . osteomax2    . OSTEOTOMY      Gynecologic History:  LMP: Patient's last menstrual period was 01/14/2020 (approximate). Average Interval: regular Heavy Menses: no Clots: no Intermenstrual Bleeding: no Postcoital Bleeding: no Dysmenorrhea: no Contraception: none Last Pap: completed on 2019 ; result was: low-grade squamous intraepithelial neoplasia (LGSIL - encompassing HPV,mild dysplasia,CIN I)  Mammogram:n/a  Obstetric History: G0P0000  Family History:  Family History  Problem Relation Age of Onset  . Diabetes Mother   . Heart disease Mother   . Hypertension Mother     Social History:  Social History   Socioeconomic History  . Marital status: Single    Spouse name: Not on file  . Number of children: Not on file  . Years of education: Not on file  . Highest education level: Not on file  Occupational History  . Not on file  Tobacco Use  . Smoking status: Never Smoker  . Smokeless tobacco: Never Used  Vaping Use  . Vaping Use: Never used  Substance and Sexual Activity  . Alcohol use: No    Alcohol/week: 0.0 standard drinks  . Drug use: No  . Sexual activity: Yes    Partners: Male    Birth control/protection: Injection, None  Other Topics Concern   . Not on file  Social History Narrative  . Not on file   Social Determinants of Health   Financial Resource Strain: Not on file  Food Insecurity: Not on file  Transportation Needs: Not on file  Physical Activity: Not on file  Stress: Not on file  Social Connections: Not on file  Intimate Partner Violence: Not on file    Allergies:  No Known Allergies  Medications: Prior to Admission medications   Medication Sig Start Date End Date Taking? Authorizing Provider  insulin aspart protamine - aspart (NOVOLOG 70/30 MIX) (70-30) 100 UNIT/ML FlexPen Inject 0.16 mLs (16 Units total) into the skin 2 (two) times daily. 01/27/18  Yes Regalado, Belkys A, MD  blood glucose meter kit and supplies Dispense based on patient and insurance preference. Use up to four times daily as directed. (FOR ICD-10 E10.9, E11.9). Patient not taking: No sig reported 01/27/18   Regalado, Belkys A, MD  medroxyPROGESTERone (DEPO-PROVERA) 150 MG/ML injection ADMINISTER 1 ML(150 MG) IN THE MUSCLE EVERY 3 MONTHS Patient not taking: Reported on 02/02/2020 11/26/17   Shelly Bombard, MD  norelgestromin-ethinyl estradiol Marilu Favre) 150-35 MCG/24HR transdermal patch Place 1 patch onto the skin once a week. Patient not taking: Reported on 02/02/2020 08/19/18   Shelly Bombard, MD    Review of Systems: negative except noted in HPI  Physical Exam Vitals: BP 128/87   Pulse (!) 102   Ht $R'5\' 6"'VS$  (1.676 m)  Wt 212 lb (96.2 kg)   LMP 01/14/2020 (Approximate)   BMI 34.22 kg/m  General: NAD HEENT: normocephalic, atraumatic Thyroid: no enlargement, no palpable nodules Pulmonary: Normal rate and effort, CTAB Cardiovascular: RRR Breast: Breast symmetrical, no tenderness, no palpable nodules or masses, no skin or nipple retraction present, no nipple discharge. No axillary or supraclavicular lymphadenopathy. Abdomen: soft, non-tender, non-distended. No hepatomegaly, splenomegaly or masses palpable. No evidence of hernia   Genitourinary:  External: Normal external female genitalia. Normal urethral meatus. Flat skin colored 25mm lesion to right labia majora, non tender with hair protruding  Vagina: Normal vaginal mucosa, no evidence of prolapse   Cervix: Grossly normal in appearance, no bleeding  Uterus: Non-enlarged, mobile, normal contour. No CMT  Adnexa: non-enlarged, no masses  Rectal: deferred Extremities: no edema, erythema, or tenderness Neurologic: Grossly intact Psychiatric: mood appropriate, affect full  Female chaperone present for pelvic and breast portions of the physical exam Results for orders placed or performed in visit on 02/02/20 (from the past 24 hour(s))  POCT urine pregnancy     Status: None   Collection Time: 02/02/20  9:41 AM  Result Value Ref Range   Preg Test, Ur Negative Negative   Assessment:  1. Screening for cervical cancer   2. Screening for STD (sexually transmitted disease)   3. Encounter for annual routine gynecological examination   4. Encounter for contraceptive management, unspecified type   5. Counseling for initiation of birth control method   6. Vulvar lesion    Plan: Lesion- folliculitis vs condyloma; recommend warm compresses Papmear and STD screen today Depo Provera- start today; add daily Calcium and Vit D Follow up with GYN in 1 year or prn Follow up with PCP in [place] for DM   Julianne Handler, CNM 02/02/2020 10:38 AM

## 2020-02-02 NOTE — Patient Instructions (Addendum)
When using Depo Provera please add Calcium 1200 mg and Vitamin D 600-100 IU daily to prevent osteoporosis.  Medroxyprogesterone injection [Contraceptive] What is this medicine? MEDROXYPROGESTERONE (me DROX ee proe JES te rone) contraceptive injections prevent pregnancy. They provide effective birth control for 3 months. Depo-SubQ Provera 104 injection is also used for treating pain related to endometriosis. This medicine may be used for other purposes; ask your health care provider or pharmacist if you have questions. COMMON BRAND NAME(S): Depo-Provera, Depo-subQ Provera 104 What should I tell my health care provider before I take this medicine? They need to know if you have any of these conditions:  asthma  blood clots  breast cancer or family history of breast cancer  depression  diabetes  eating disorder (anorexia nervosa)  heart attack  high blood pressure  HIV infection or AIDS  if you often drink alcohol  kidney disease  liver disease  migraine headaches  osteoporosis, weak bones  seizures  stroke  tobacco smoker  vaginal bleeding  an unusual or allergic reaction to medroxyprogesterone, other hormones, medicines, foods, dyes, or preservatives  pregnant or trying to get pregnant  breast-feeding How should I use this medicine? Depo-Provera CI contraceptive injection is given into a muscle. Depo-subQ Provera 104 injection is given under the skin. It is given by a health care provider in a hospital or clinic setting. The injection is usually given during the first 5 days after the start of a menstrual period or 6 weeks after delivery of a baby. A patient package insert for the product will be given with each prescription and refill. Be sure to read this information carefully each time. The sheet may change often. Talk to your pediatrician regarding the use of this medicine in children. Special care may be needed. These injections have been used in female  children who have started having menstrual periods. Overdosage: If you think you have taken too much of this medicine contact a poison control center or emergency room at once. NOTE: This medicine is only for you. Do not share this medicine with others. What if I miss a dose? Keep appointments for follow-up doses. You must get an injection once every 3 months. It is important not to miss your dose. Call your health care provider if you are unable to keep an appointment. What may interact with this medicine?  antibiotics or medicines for infections, especially rifampin and griseofulvin  antivirals for HIV or hepatitis  aprepitant  armodafinil  bexarotene  bosentan  medicines for seizures like carbamazepine, felbamate, oxcarbazepine, phenytoin, phenobarbital, primidone, topiramate  mitotane  modafinil  St. John's wort This list may not describe all possible interactions. Give your health care provider a list of all the medicines, herbs, non-prescription drugs, or dietary supplements you use. Also tell them if you smoke, drink alcohol, or use illegal drugs. Some items may interact with your medicine. What should I watch for while using this medicine? This drug does not protect you against HIV infection (AIDS) or other sexually transmitted diseases. Use of this product may cause you to lose calcium from your bones. Loss of calcium may cause weak bones (osteoporosis). Only use this product for more than 2 years if other forms of birth control are not right for you. The longer you use this product for birth control the more likely you will be at risk for weak bones. Ask your health care professional how you can keep strong bones. You may have a change in bleeding pattern or irregular   periods. Many females stop having periods while taking this drug. If you have received your injections on time, your chance of being pregnant is very low. If you think you may be pregnant, see your health care  professional as soon as possible. Tell your health care professional if you want to get pregnant within the next year. The effect of this medicine may last a long time after you get your last injection. What side effects may I notice from receiving this medicine? Side effects that you should report to your doctor or health care professional as soon as possible:  allergic reactions like skin rash, itching or hives, swelling of the face, lips, or tongue  blood clot (chest pain; shortness of breath; pain, swelling, or warmth in the leg)  breast tenderness or discharge  changes in emotions or moods  changes in vision  liver injury (dark yellow or brown urine; general ill feeling or flu-like symptoms; loss of appetite, right upper belly pain; unusually weak or tired, yellowing of the eyes or skin)  persistent pain, pus, or bleeding at the injection site  stroke (changes in vision; confusion; trouble speaking or understanding; severe headaches; sudden numbness or weakness of the face, arm or leg; trouble walking; dizziness; loss of balance or coordination)  trouble breathing Side effects that usually do not require medical attention (report to your doctor or health care professional if they continue or are bothersome):  change in sex drive  dizziness  fluid retention  headache  irregular periods, spotting, or absent periods  pain, redness, or irritation at site where injected  stomach pain  weight gain This list may not describe all possible side effects. Call your doctor for medical advice about side effects. You may report side effects to FDA at 1-800-FDA-1088. Where should I keep my medicine? This injection is only given by a health care provider. It will not be stored at home. NOTE: This sheet is a summary. It may not cover all possible information. If you have questions about this medicine, talk to your doctor, pharmacist, or health care provider.  2021 Elsevier/Gold  Standard (2019-02-10 10:29:21)  

## 2020-02-03 LAB — CERVICOVAGINAL ANCILLARY ONLY
Bacterial Vaginitis (gardnerella): POSITIVE — AB
Candida Glabrata: POSITIVE — AB
Candida Vaginitis: NEGATIVE
Chlamydia: POSITIVE — AB
Comment: NEGATIVE
Comment: NEGATIVE
Comment: NEGATIVE
Comment: NEGATIVE
Comment: NEGATIVE
Comment: NORMAL
Neisseria Gonorrhea: NEGATIVE
Trichomonas: NEGATIVE

## 2020-02-04 ENCOUNTER — Other Ambulatory Visit: Payer: Self-pay

## 2020-02-04 MED ORDER — AZITHROMYCIN 500 MG PO TABS: ORAL_TABLET | ORAL | 0 refills | Status: DC

## 2020-02-04 MED ORDER — FLUCONAZOLE 150 MG PO TABS: 150.0000 mg | ORAL_TABLET | Freq: Once | ORAL | 0 refills | Status: AC

## 2020-02-04 MED ORDER — METRONIDAZOLE 500 MG PO TABS: 500.0000 mg | ORAL_TABLET | Freq: Two times a day (BID) | ORAL | 0 refills | Status: DC

## 2020-02-04 NOTE — Telephone Encounter (Signed)
Patient tested positive for chlamydia, yeast and bv. I will go ahead and treat per protocols.

## 2020-02-07 ENCOUNTER — Other Ambulatory Visit: Payer: Self-pay

## 2020-02-07 DIAGNOSIS — A749 Chlamydial infection, unspecified: Secondary | ICD-10-CM

## 2020-02-07 MED ORDER — AZITHROMYCIN 500 MG PO TABS: ORAL_TABLET | ORAL | 0 refills | Status: DC

## 2020-02-07 MED ORDER — AZITHROMYCIN 500 MG PO TABS: 1000.0000 mg | ORAL_TABLET | Freq: Once | ORAL | 1 refills | Status: AC

## 2020-02-07 NOTE — Progress Notes (Signed)
Rx was not sent correctly  Rx resent today.  Pt made aware.

## 2020-02-09 LAB — CYTOLOGY - PAP
Adequacy: ABSENT
Comment: NEGATIVE
Diagnosis: UNDETERMINED — AB
High risk HPV: POSITIVE — AB

## 2020-02-14 ENCOUNTER — Telehealth: Payer: Self-pay

## 2020-02-14 NOTE — Telephone Encounter (Signed)
Call patient to inform her of test results. No answer or voice mail to leave a message.  

## 2020-02-14 NOTE — Telephone Encounter (Signed)
-----   Message from Donette Larry, [place] sent at 02/11/2020  3:10 PM EST ----- Please notify pt of abnormal papsmear and she needs to be scheduled for colpo.

## 2020-04-24 ENCOUNTER — Ambulatory Visit: Payer: BLUE CROSS/BLUE SHIELD

## 2020-04-26 ENCOUNTER — Other Ambulatory Visit: Payer: Self-pay

## 2020-04-26 ENCOUNTER — Ambulatory Visit (INDEPENDENT_AMBULATORY_CARE_PROVIDER_SITE_OTHER): Payer: BLUE CROSS/BLUE SHIELD

## 2020-04-26 DIAGNOSIS — Z3042 Encounter for surveillance of injectable contraceptive: Secondary | ICD-10-CM | POA: Diagnosis not present

## 2020-04-26 NOTE — Progress Notes (Signed)
Patient was assessed and managed by nursing staff during this encounter. I have reviewed the chart and agree with the documentation and plan. I have also made any necessary editorial changes.  Monetta Lick, MD 04/26/2020 11:43 AM 

## 2020-04-26 NOTE — Progress Notes (Signed)
Subjective:  Pt in for Depo Provera injection.    Objective: Need for contraception. No unusual complaints.    Assessment: Pt tolerated Depo injection. Depo given RD  Plan:  Next injection due July 6-20

## 2020-06-20 ENCOUNTER — Telehealth: Payer: Self-pay

## 2020-06-20 NOTE — Telephone Encounter (Signed)
Telephoned patient at mobile number. Left a voice message [facility] contact information.

## 2020-06-29 ENCOUNTER — Telehealth: Payer: Self-pay

## 2020-06-29 NOTE — Telephone Encounter (Signed)
Telephoned patient at mobile number. Person answered and stated incorrect phone number for this person.

## 2020-07-13 ENCOUNTER — Ambulatory Visit: Payer: BLUE CROSS/BLUE SHIELD

## 2020-07-19 ENCOUNTER — Other Ambulatory Visit: Payer: Self-pay

## 2020-07-19 ENCOUNTER — Ambulatory Visit (INDEPENDENT_AMBULATORY_CARE_PROVIDER_SITE_OTHER): Payer: BLUE CROSS/BLUE SHIELD

## 2020-07-19 VITALS — BP 126/88 | HR 96 | Ht 65.0 in | Wt 199.0 lb

## 2020-07-19 DIAGNOSIS — Z3042 Encounter for surveillance of injectable contraceptive: Secondary | ICD-10-CM

## 2020-07-19 MED ORDER — MEDROXYPROGESTERONE ACETATE 150 MG/ML IM SUSP
150.0000 mg | Freq: Once | INTRAMUSCULAR | Status: AC
  Administered 2020-07-19: 150 mg via INTRAMUSCULAR

## 2020-07-19 NOTE — Progress Notes (Signed)
SUBJECTIVE: Amanda Kent is a 25 y.o. female who presents for DEPO Injection.  OBJECTIVE: Appears well, in no apparent distress.  Vital signs are normal.    ASSESSMENT: Within window for DEPO BC. Last PAP 02/02/2020  PLAN:  DEPO Injection given in LD, tolerated well.  Next DEPO due Sept. 28 - Oct. 13, 2022  Administrations This Visit     medroxyPROGESTERone (DEPO-PROVERA) injection 150 mg     Admin Date 07/19/2020 Action Given Dose 150 mg Route Intramuscular Administered By Maretta Bees, RMA

## 2020-08-08 ENCOUNTER — Ambulatory Visit: Payer: BLUE CROSS/BLUE SHIELD

## 2020-08-18 ENCOUNTER — Ambulatory Visit: Payer: BLUE CROSS/BLUE SHIELD | Admitting: Obstetrics and Gynecology

## 2020-08-26 ENCOUNTER — Encounter (HOSPITAL_COMMUNITY): Payer: Self-pay

## 2020-08-26 ENCOUNTER — Other Ambulatory Visit: Payer: Self-pay

## 2020-08-26 ENCOUNTER — Emergency Department (HOSPITAL_COMMUNITY)
Admission: EM | Admit: 2020-08-26 | Discharge: 2020-08-26 | Disposition: A | Discharge status: Home / Self Care | Payer: BLUE CROSS/BLUE SHIELD | Attending: Emergency Medicine | Admitting: Emergency Medicine

## 2020-08-26 DIAGNOSIS — J45909 Unspecified asthma, uncomplicated: Secondary | ICD-10-CM | POA: Insufficient documentation

## 2020-08-26 DIAGNOSIS — X58XXXA Exposure to other specified factors, initial encounter: Secondary | ICD-10-CM | POA: Diagnosis not present

## 2020-08-26 DIAGNOSIS — Z794 Long term (current) use of insulin: Secondary | ICD-10-CM | POA: Insufficient documentation

## 2020-08-26 DIAGNOSIS — S00551A Superficial foreign body of lip, initial encounter: Secondary | ICD-10-CM | POA: Insufficient documentation

## 2020-08-26 DIAGNOSIS — E119 Type 2 diabetes mellitus without complications: Secondary | ICD-10-CM | POA: Insufficient documentation

## 2020-08-26 DIAGNOSIS — Z789 Other specified health status: Secondary | ICD-10-CM

## 2020-08-26 MED ORDER — LIDOCAINE-EPINEPHRINE-TETRACAINE (LET) TOPICAL GEL
3.0000 mL | Freq: Once | TOPICAL | Status: AC
  Administered 2020-08-26: 3 mL via TOPICAL
  Filled 2020-08-26: qty 3

## 2020-08-26 MED ORDER — LIDOCAINE HCL (PF) 1 % IJ SOLN: 10.0000 mL | Freq: Once | INTRAMUSCULAR | Status: DC

## 2020-08-26 NOTE — ED Triage Notes (Signed)
Pt arrived via POV, states had lip pierced x3 months ago, recent jewelery change and states skin has grown over.

## 2020-08-26 NOTE — Discharge Instructions (Addendum)
[facility] to be evaluated for your lip piercing complications.  Your lip piercing was successfully removed.  Get help right away if: You develop severe swelling around your wound. Your pain suddenly increases and is severe. You develop painful skin lumps. You have a red streak going away from your wound.

## 2020-08-26 NOTE — ED Provider Notes (Signed)
[facility] DEPT Provider Note   CSN: [id number] Arrival date & time: 08/26/20  1115     History No chief complaint on file.   Amanda Kent is a 25 y.o. female with history of diabetes mellitus, asthma.  Presents to the emergency department with a chief complaint of lip piercing complication.  Patient states that she had her upper lip pierced 3 months prior.  2 weeks prior patient changed the jewelry and her lip piercing.  Patient states that she has been unable to remove piercing since this time.  States that skin has gradually started closing over the piercing.  Patient denies any pain, purulent discharge, erythema, or rash.   HPI     Past Medical History:  Diagnosis Date   Allergy    Asthma    Diabetes mellitus without complication [facility])     Patient Active Problem List   Diagnosis Date Noted   DKA (diabetic ketoacidoses) 01/26/2018   Nausea & vomiting 01/26/2018   DKA, type 2 ([place]) 01/26/2018   LGSIL on Pap smear of cervix 03/26/2017   Type 2 diabetes mellitus without complication, without long-term current use of insulin ([place]) 02/07/2015   BMI 32.0-32.9,adult 02/07/2015    Past Surgical History:  Procedure Laterality Date   osteomax2     OSTEOTOMY       OB History     Gravida  0   Para  0   Term  0   Preterm  0   AB  0   Living  0      SAB  0   IAB  0   Ectopic  0   Multiple  0   Live Births  0           Family History  Problem Relation Age of Onset   Diabetes Mother    Heart disease Mother    Hypertension Mother     Social History   Tobacco Use   Smoking status: Never   Smokeless tobacco: Never  Vaping Use   Vaping Use: Never used  Substance Use Topics   Alcohol use: No    Alcohol/week: 0.0 standard drinks   Drug use: No    Home Medications Prior to Admission medications   Medication Sig Start Date End Date Taking? Authorizing Provider  azithromycin (ZITHROMAX) 500 MG tablet  Azithromycin 1 gram PO once. Please take with food Patient not taking: Reported on 04/26/2020 02/07/20   Griffin Basil, MD  blood glucose meter kit and supplies Dispense based on patient and insurance preference. Use up to four times daily as directed. (FOR ICD-10 E10.9, E11.9). Patient not taking: No sig reported 01/27/18   Regalado, Belkys A, MD  insulin aspart protamine - aspart (NOVOLOG 70/30 MIX) (70-30) 100 UNIT/ML FlexPen Inject 0.16 mLs (16 Units total) into the skin 2 (two) times daily. 01/27/18   Regalado, Jerald Kief A, MD  medroxyPROGESTERone (DEPO-PROVERA) 150 MG/ML injection ADMINISTER 1 ML(150 MG) IN THE MUSCLE EVERY 3 MONTHS 11/26/17   Shelly Bombard, MD  medroxyPROGESTERone (DEPO-PROVERA) 150 MG/ML injection Inject 1 mL (150 mg total) into the muscle every 3 (three) months. 02/02/20   Julianne Handler, CNM  metroNIDAZOLE (FLAGYL) 500 MG tablet Take 1 tablet (500 mg total) by mouth 2 (two) times daily. Patient not taking: Reported on 04/26/2020 02/04/20   Julianne Handler, CNM  norelgestromin-ethinyl estradiol Marilu Favre) 150-35 MCG/24HR transdermal patch Place 1 patch onto the skin once a week. Patient not taking: Reported on 04/26/2020  08/19/18   Shelly Bombard, MD    Allergies    Patient has no known allergies.  Review of Systems   Review of Systems  Constitutional:  Negative for chills and fever.  HENT:  Negative for mouth sores.   Skin:  Negative for color change, pallor, rash and wound.   Physical Exam Updated Vital Signs BP (!) 140/93 (BP Location: Left Arm)   Pulse 96   Temp 98.9 F (37.2 C) (Oral)   Resp 18   SpO2 100%   Physical Exam Vitals and nursing note reviewed.  Constitutional:      General: She is not in acute distress.    Appearance: She is not ill-appearing, toxic-appearing or diaphoretic.  HENT:     Head: Normocephalic.     Mouth/Throat:     Lips: Pink. No lesions.     Mouth: Mucous membranes are moist. No injury, lacerations or angioedema.      Pharynx: Oropharynx is clear. Uvula midline. No pharyngeal swelling, oropharyngeal exudate, posterior oropharyngeal erythema or uvula swelling.      Comments: Patient has lip piercing to upper lip partially covered by skin.  No surrounding erythema, rash, or purulent discharge noted.  Patient able to handle oral secretions without difficulty. Eyes:     General: No scleral icterus.       Right eye: No discharge.        Left eye: No discharge.  Cardiovascular:     Rate and Rhythm: Normal rate.  Pulmonary:     Effort: Pulmonary effort is normal.  Skin:    General: Skin is warm and dry.  Neurological:     General: No focal deficit present.     Mental Status: She is alert and oriented to person, place, and time.     GCS: GCS eye subscore is 4. GCS verbal subscore is 5. GCS motor subscore is 6.  Psychiatric:        Behavior: Behavior is cooperative.    ED Results / Procedures / Treatments   Labs (all labs ordered are listed, but only abnormal results are displayed) Labs Reviewed - No data to display  EKG None  Radiology No results found.  Procedures .Foreign Body Removal  Date/Time: 08/26/2020 11:53 PM Performed by: Loni Beckwith, PA-C Authorized by: Loni Beckwith, PA-C  Consent: Verbal consent obtained. Risks and benefits: risks, benefits and alternatives were discussed Consent given by: patient Patient understanding: patient states understanding of the procedure being performed Patient consent: the patient's understanding of the procedure matches consent given Patient identity confirmed: verbally with patient Intake: lip. Anesthesia method: topical.  Anesthesia: Local Anesthetic: LET (lido,epi,tetracaine) Complexity: simple 1 objects recovered. Post-procedure assessment: foreign body removed Patient tolerance: patient tolerated the procedure well with no immediate complications    Medications Ordered in ED Medications - No data to display  ED Course   I have reviewed the triage vital signs and the nursing notes.  Pertinent labs & imaging results that were available during my care of the patient were reviewed by me and considered in my medical decision making (see chart for details).    MDM Rules/Calculators/A&P                           Alert 25 year old female no acute distress, nontoxic appearing.  Presents to the emergency department with a chief complaint of lip piercing complication.  Lip piercing is noted to be partially covered by thin layer of  skin.  No signs of infection.  Patient is requesting lip piercing removed.  Patient has been unable to remove lip piercing at home.  Let applied to skin overlying piercing.  Piercing was fully exposed with gentle pressure from is a piercing to the underside of patient's lip.  Needle drivers were used to grasp piercing and piercing was carefully removed.  Patient tolerated procedure well.  Patient advised to refrain from reinsertion of lip jewelry.  Discussed results, findings, treatment and follow up. Patient advised of return precautions. Patient verbalized understanding and agreed with plan.   Final Clinical Impression(s) / ED Diagnoses Final diagnoses:  Body piercing    Rx / DC Orders ED Discharge Orders     None        Dyann Ruddle 08/26/20 [id number]    Milton Ferguson, MD 08/28/20 [phone]

## 2020-10-11 ENCOUNTER — Ambulatory Visit: Payer: Medicaid Other

## 2020-10-31 IMAGING — CR DG ANKLE COMPLETE 3+V*L*
3 series · 3 of 3 positions shown · non-contrast
Comparison: None.

CLINICAL DATA: Ankle pain secondary to a fall outside at home
today.

EXAM:
LEFT ANKLE COMPLETE - 3+ VIEW

[x ankle ap left]
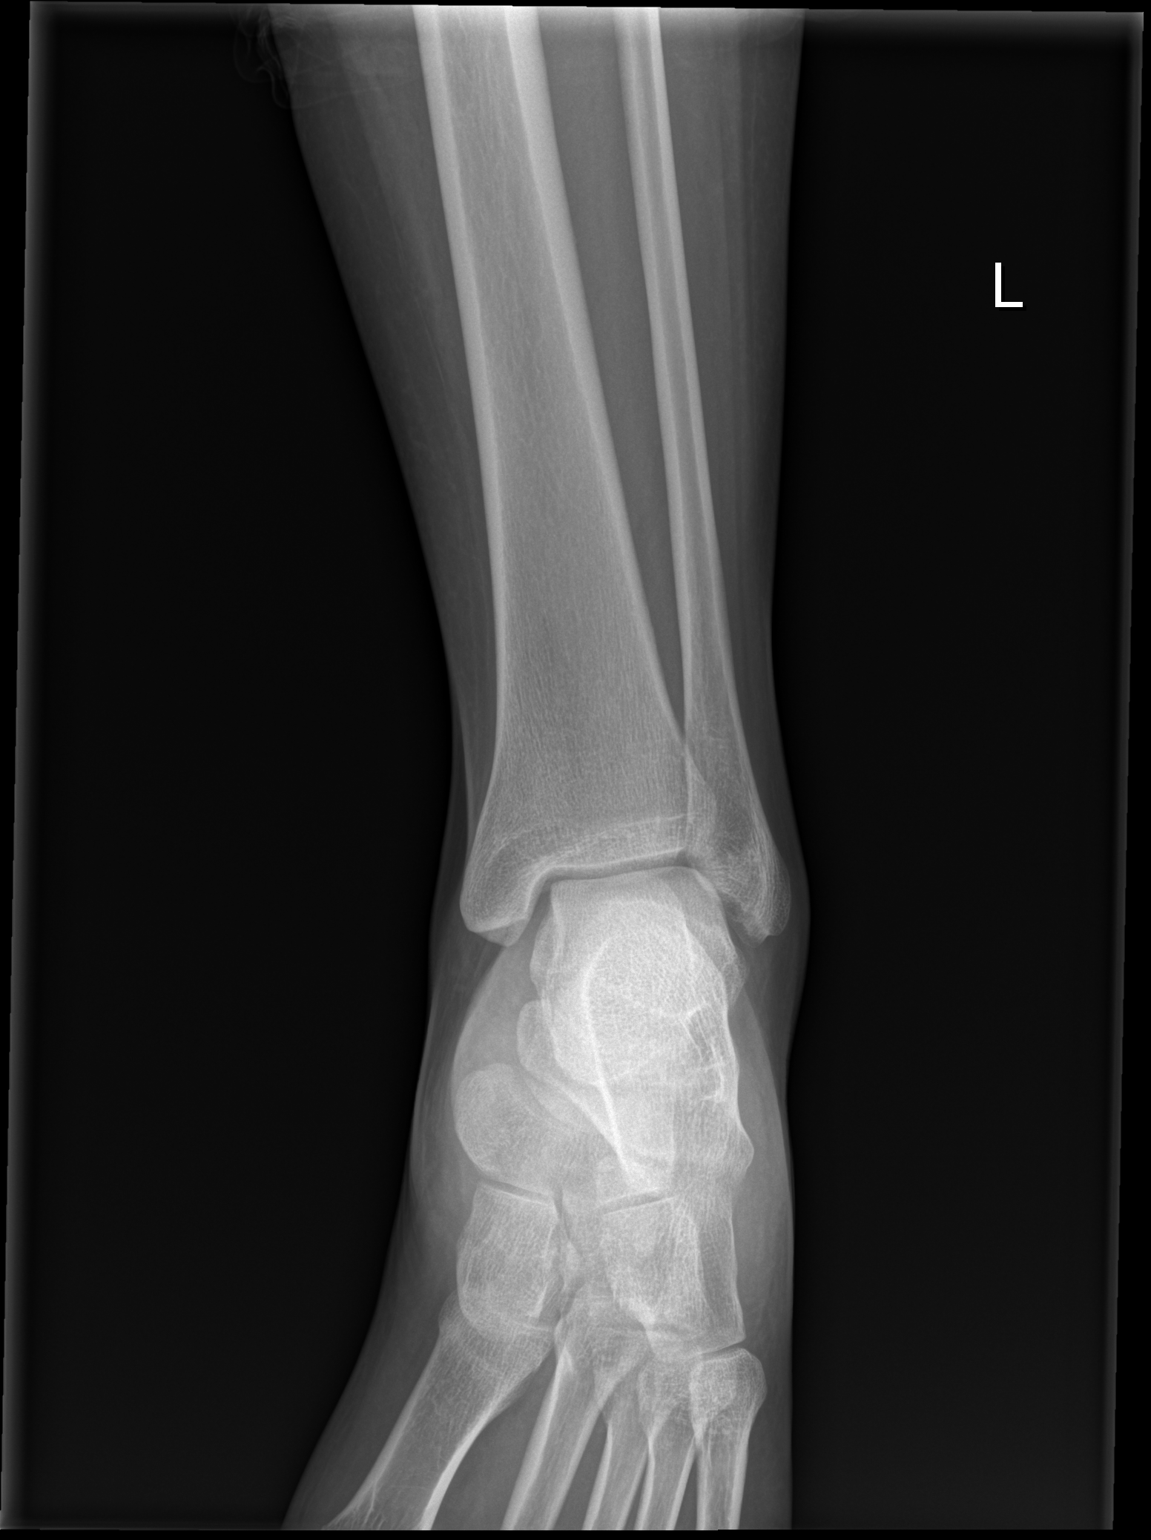

[x ankle obl left]
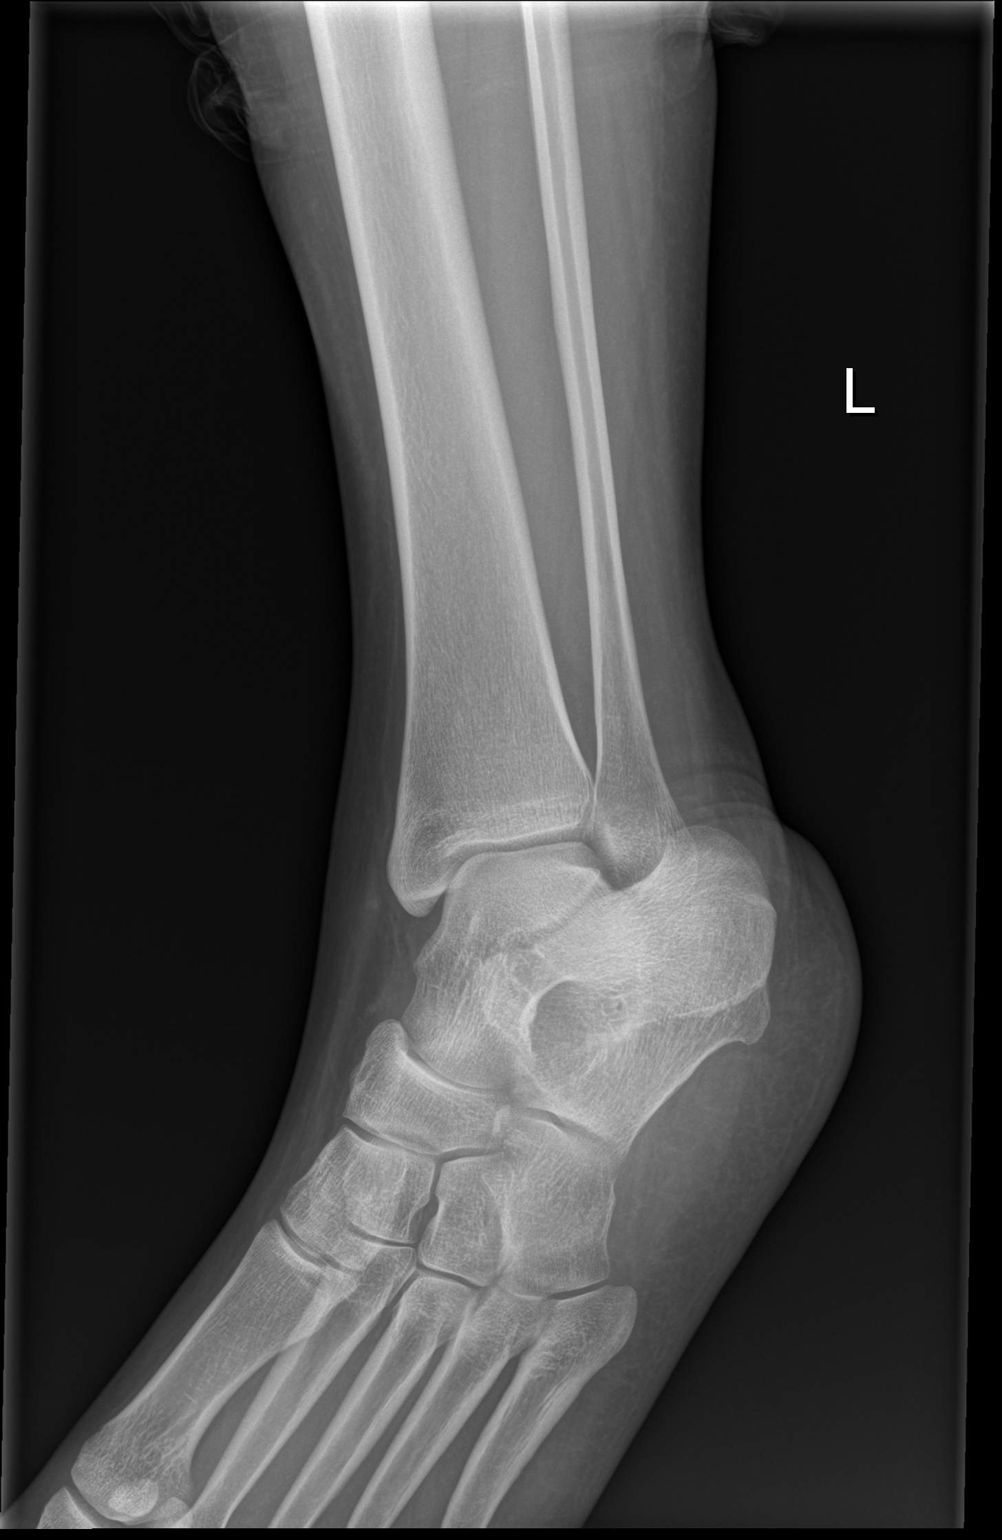

[x ankle lat left]
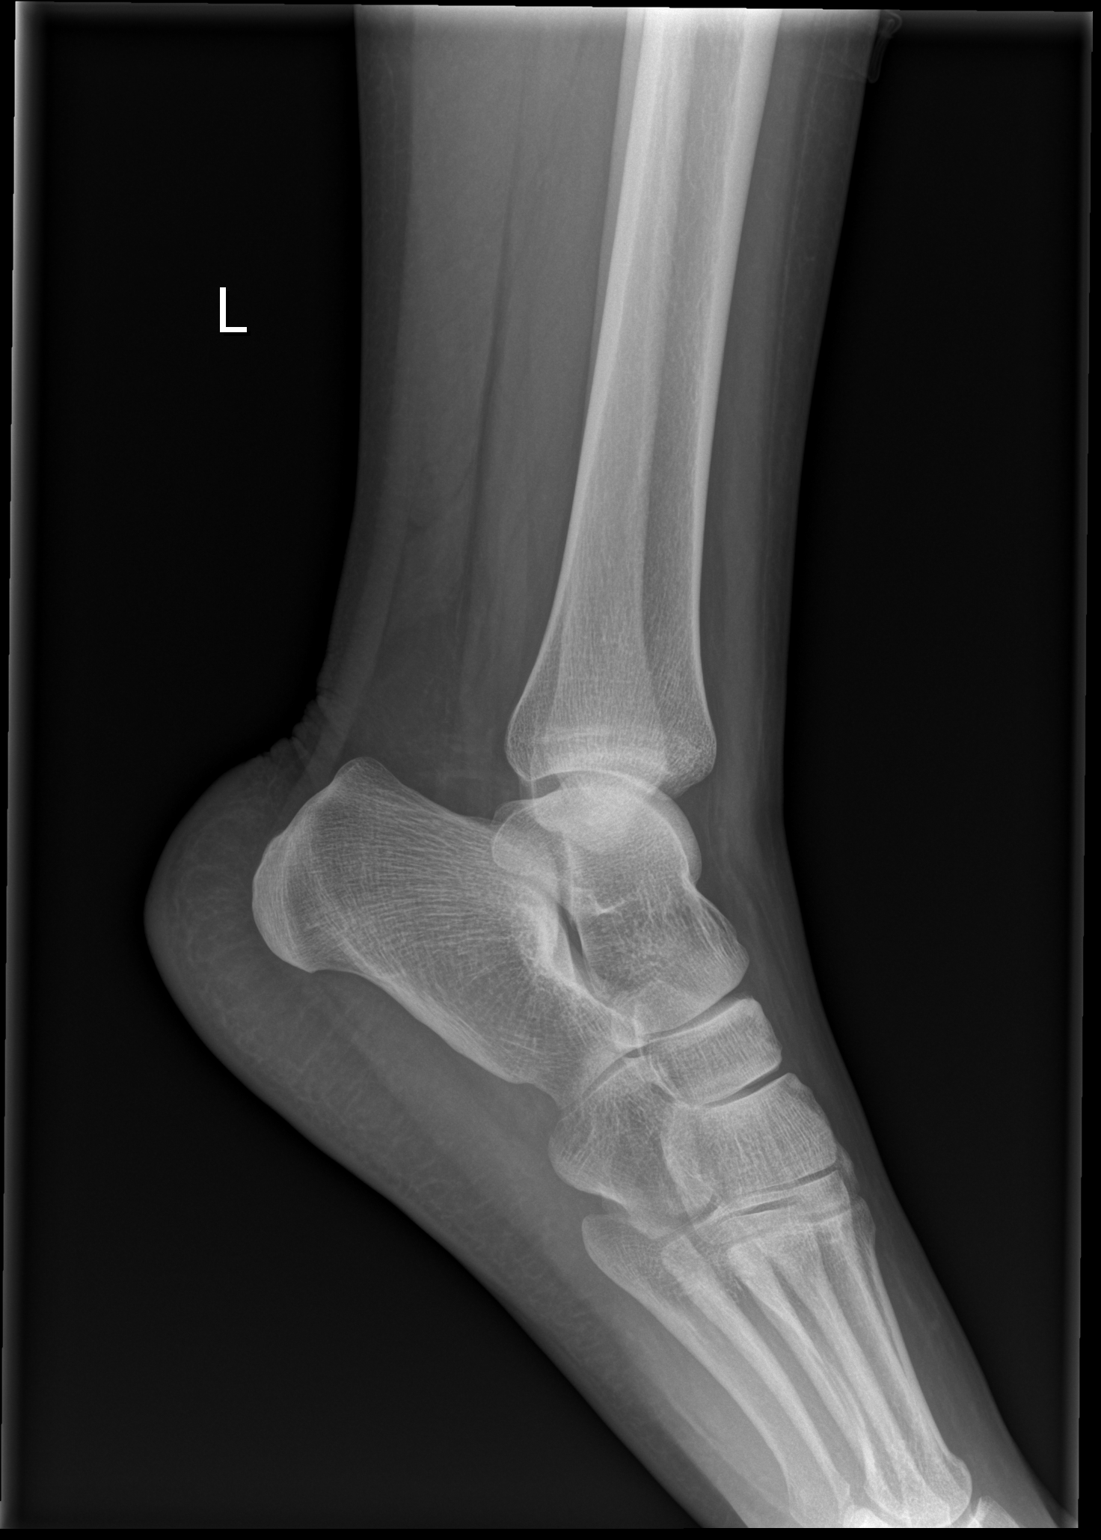

[3 of 3 positions shown; findings below may reference images not displayed]

FINDINGS: There is no evidence of fracture, dislocation, or joint effusion.
There is no evidence of arthropathy or other focal bone abnormality.
Soft tissues are unremarkable.
IMPRESSION: Negative.

## 2020-10-31 IMAGING — CR DG ANKLE COMPLETE 3+V*R*
3 series · 3 of 3 positions shown · non-contrast
Comparison: Radiographs dated 05/04/2004

CLINICAL DATA: Right ankle pain secondary to a fall outside at home
today.

EXAM:
RIGHT ANKLE - COMPLETE 3+ VIEW

[x ankle ap right]
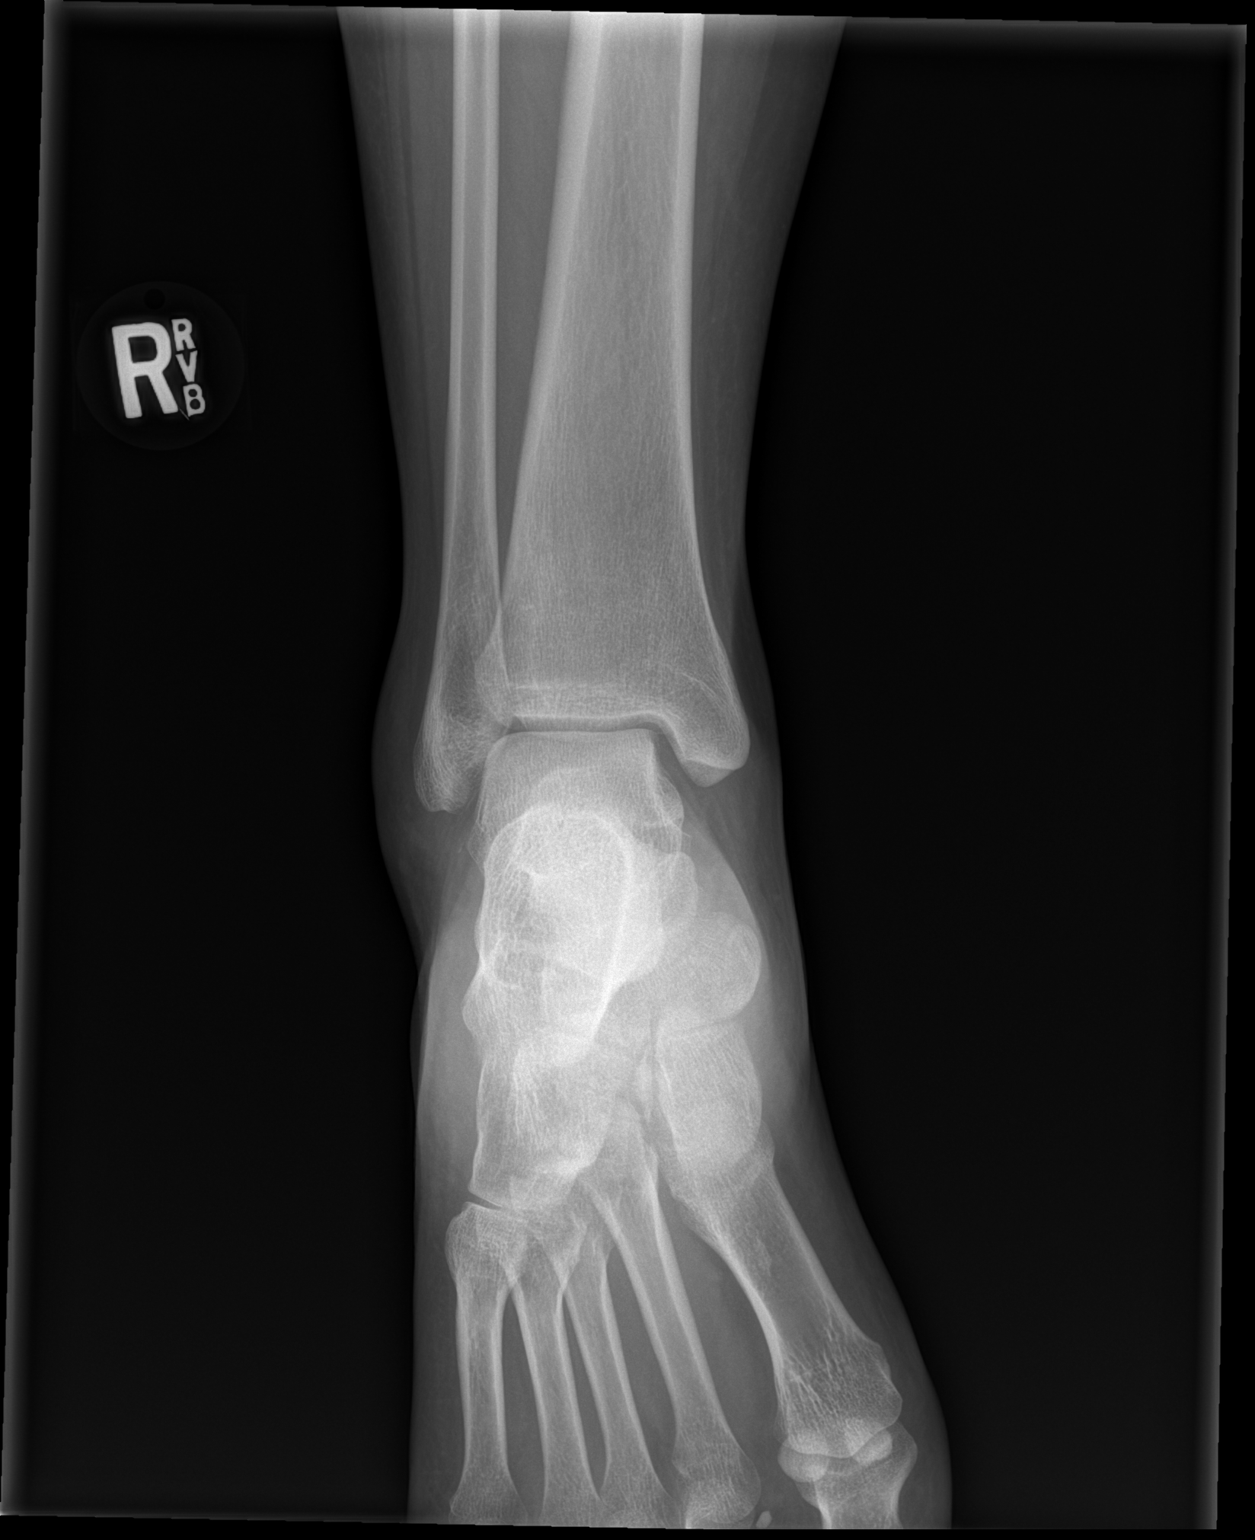

[x ankle obl right]
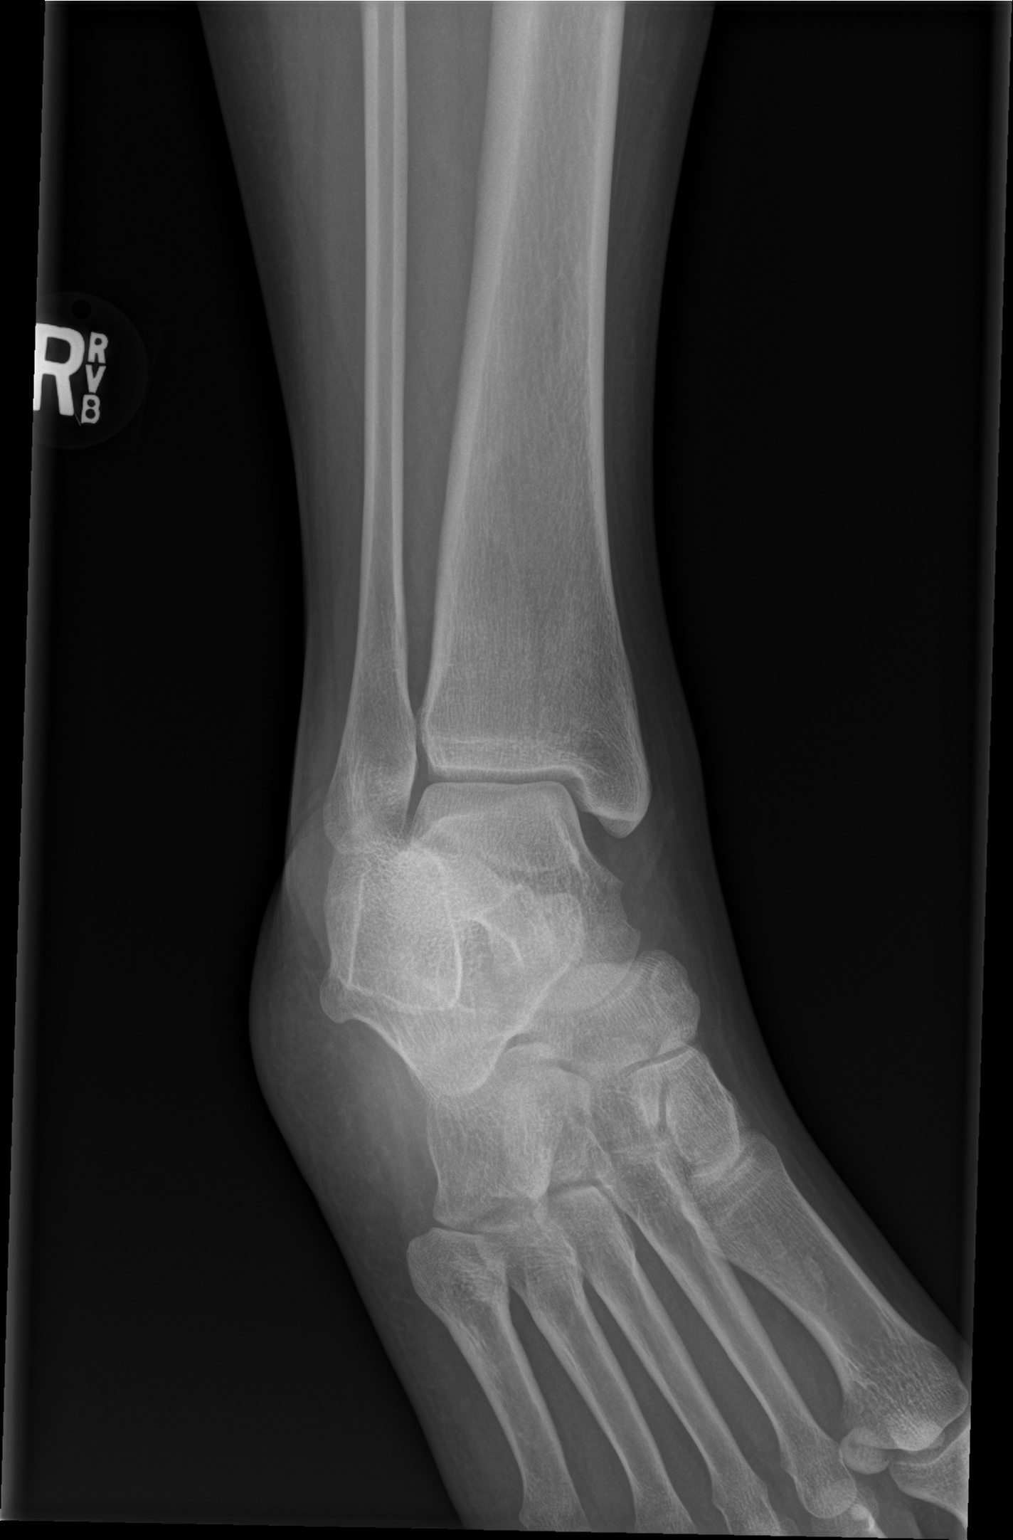

[x ankle lat right]
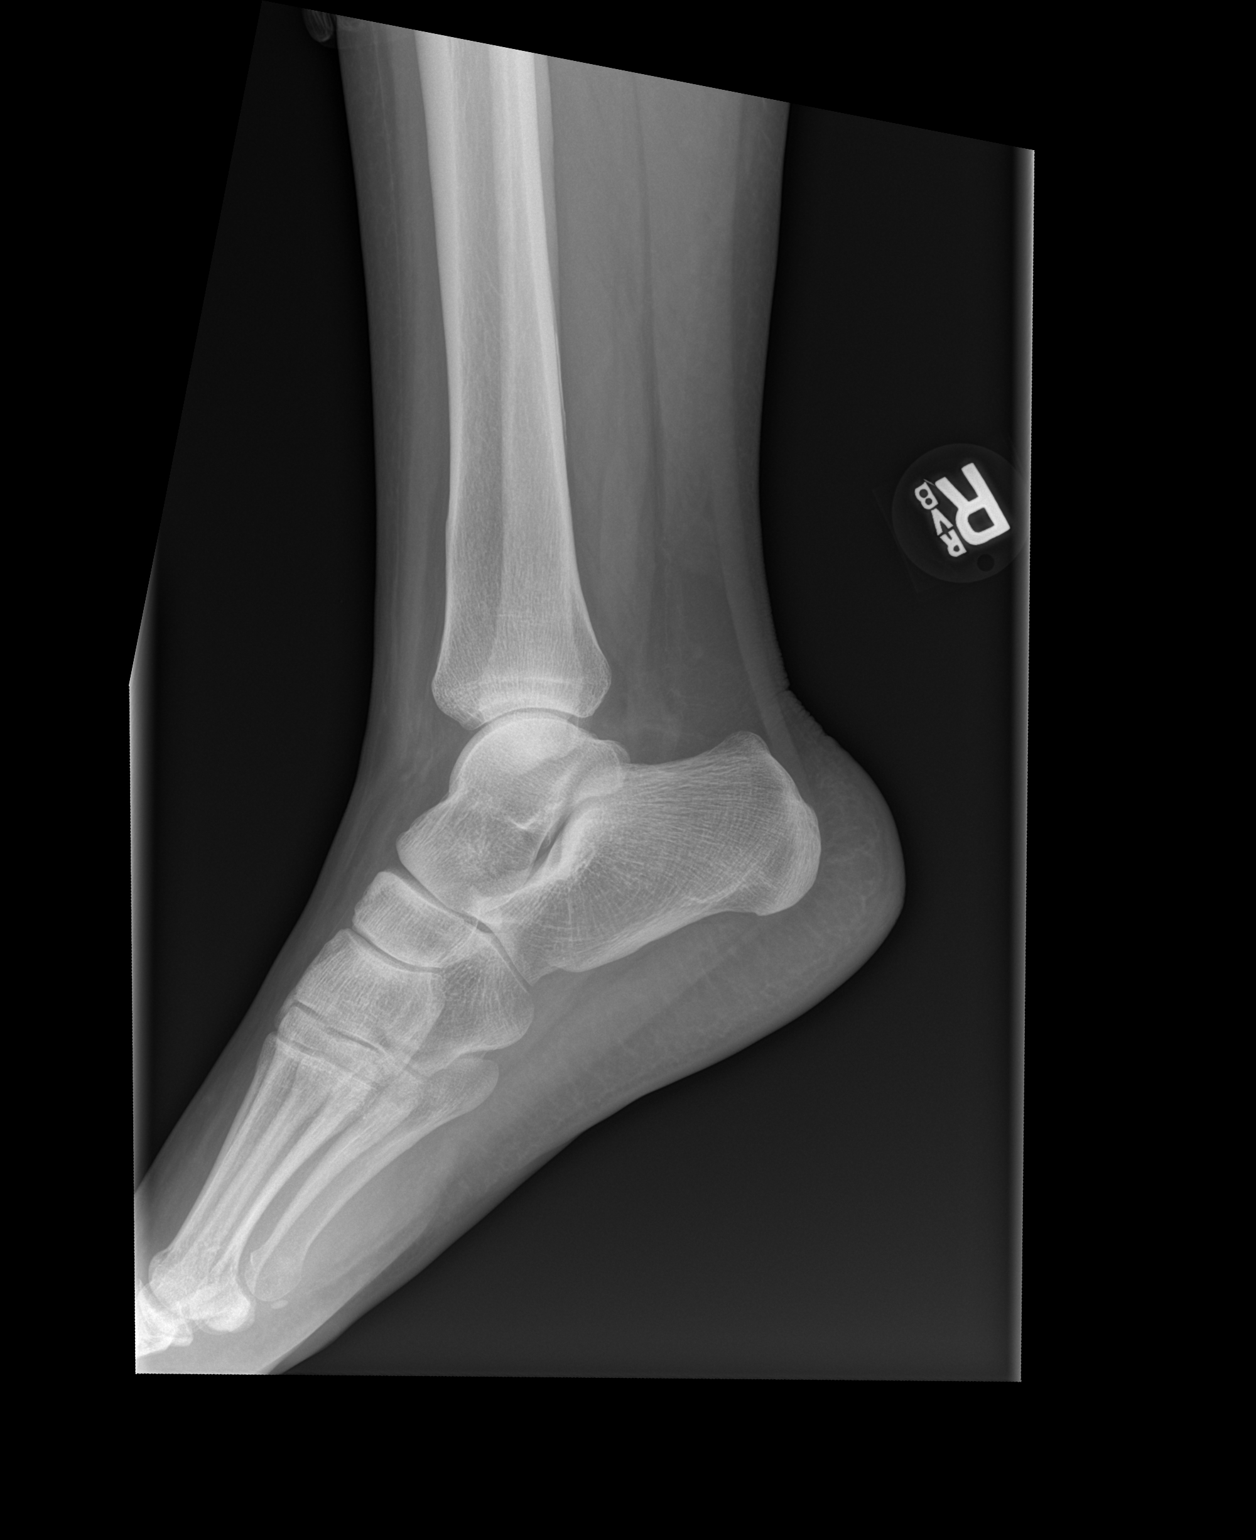

[3 of 3 positions shown; findings below may reference images not displayed]

FINDINGS: There is no evidence of fracture, dislocation, or joint effusion.
There is no evidence of arthropathy or other focal bone abnormality.
There is soft tissue swelling over the lateral malleolus.
IMPRESSION: No bone abnormality. Soft tissue swelling at the lateral aspect of
the ankle.

## 2020-11-02 ENCOUNTER — Telehealth: Payer: Self-pay

## 2020-11-02 NOTE — Telephone Encounter (Signed)
Pt called stating that she wanted to change her birth control from Depo back to the patch. Advised patient that she would need an OV to come in discuss that due to being outside of the dates for her Depo and that she has had some documented elevated BP readings. Pt states she is unable to make an appointment right now due to starting a new job. Also advised patient she needs to reschedule colposcopy due to abnormal pap smear and patient states that she never received any information or phone calls from [facility] program. Advised pt that they would reach back out to her to get that rescheduled and that she can coordinate with her employer to get OV scheduled for [facility] consult. Pt agreed and verbalized understanding.

## 2022-06-19 ENCOUNTER — Encounter: Payer: Self-pay | Admitting: Obstetrics

## 2022-06-19 ENCOUNTER — Ambulatory Visit (INDEPENDENT_AMBULATORY_CARE_PROVIDER_SITE_OTHER): Payer: Medicaid Other | Admitting: Obstetrics

## 2022-06-19 ENCOUNTER — Other Ambulatory Visit (HOSPITAL_COMMUNITY)
Admission: RE | Admit: 2022-06-19 | Discharge: 2022-06-19 | Disposition: A | Discharge status: Home / Self Care | Payer: Medicaid Other | Source: Ambulatory Visit | Attending: Obstetrics | Admitting: Obstetrics

## 2022-06-19 VITALS — BP 128/93 | HR 132 | Ht 66.0 in | Wt 190.0 lb

## 2022-06-19 DIAGNOSIS — N898 Other specified noninflammatory disorders of vagina: Secondary | ICD-10-CM | POA: Insufficient documentation

## 2022-06-19 DIAGNOSIS — Z113 Encounter for screening for infections with a predominantly sexual mode of transmission: Secondary | ICD-10-CM

## 2022-06-19 DIAGNOSIS — Z01419 Encounter for gynecological examination (general) (routine) without abnormal findings: Secondary | ICD-10-CM | POA: Insufficient documentation

## 2022-06-19 DIAGNOSIS — E669 Obesity, unspecified: Secondary | ICD-10-CM

## 2022-06-19 DIAGNOSIS — R102 Pelvic and perineal pain: Secondary | ICD-10-CM | POA: Diagnosis not present

## 2022-06-19 DIAGNOSIS — E139 Other specified diabetes mellitus without complications: Secondary | ICD-10-CM

## 2022-06-19 MED ORDER — FLUCONAZOLE 200 MG PO TABS
200.0000 mg | ORAL_TABLET | ORAL | 2 refills | Status: DC
Start: 2022-06-19 — End: 2022-06-19

## 2022-06-19 MED ORDER — FLUCONAZOLE 200 MG PO TABS
200.0000 mg | ORAL_TABLET | ORAL | 4 refills | Status: DC
Start: 2022-06-19 — End: 2022-10-30

## 2022-06-19 MED ORDER — METRONIDAZOLE 500 MG PO TABS
500.0000 mg | ORAL_TABLET | Freq: Two times a day (BID) | ORAL | 0 refills | Status: AC
Start: 2022-06-19 — End: ?

## 2022-06-19 MED ORDER — METRONIDAZOLE 500 MG PO TABS
500.0000 mg | ORAL_TABLET | Freq: Two times a day (BID) | ORAL | 2 refills | Status: DC
Start: 2022-06-19 — End: 2022-06-19

## 2022-06-19 MED ORDER — DOXYCYCLINE HYCLATE 100 MG PO CAPS
100.0000 mg | ORAL_CAPSULE | Freq: Two times a day (BID) | ORAL | 0 refills | Status: AC
Start: 2022-06-19 — End: ?

## 2022-06-19 MED ORDER — KETOROLAC TROMETHAMINE 60 MG/2ML IM SOLN
60.0000 mg | Freq: Once | INTRAMUSCULAR | Status: AC
Start: 2022-06-19 — End: 2022-06-19
  Administered 2022-06-19: 60 mg via INTRAMUSCULAR

## 2022-06-19 MED ORDER — IBUPROFEN 800 MG PO TABS
800.0000 mg | ORAL_TABLET | Freq: Three times a day (TID) | ORAL | 5 refills | Status: AC | PRN
Start: 2022-06-19 — End: ?

## 2022-06-19 MED ORDER — DOXYCYCLINE HYCLATE 100 MG PO CAPS
100.0000 mg | ORAL_CAPSULE | Freq: Two times a day (BID) | ORAL | 0 refills | Status: DC
Start: 2022-06-19 — End: 2022-06-19

## 2022-06-19 NOTE — Progress Notes (Signed)
Subjective:        Amanda Kent is a 27 y.o. female here for a routine exam.  Current complaints: Vaginal discharge.    Personal health questionnaire:  Is patient Amanda Kent, have a family history of breast and/or ovarian cancer: no Is there a family history of uterine cancer diagnosed at age < 64, gastrointestinal cancer, urinary tract cancer, family member who is a Personnel officer syndrome-associated carrier: no Is the patient overweight and hypertensive, family history of diabetes, personal history of gestational diabetes, preeclampsia or PCOS: yes Is patient over 15, have PCOS,  family history of premature CHD under age 77, diabetes, smoke, have hypertension or peripheral artery disease:  no At any time, has a partner hit, kicked or otherwise hurt or frightened you?: no Over the past 2 weeks, have you felt down, depressed or hopeless?: no Over the past 2 weeks, have you felt little interest or pleasure in doing things?:no   Gynecologic History Patient's last menstrual period was 06/01/2022 (approximate). Contraception: none Last Pap: 2022. Results were: abnormal Last mammogram: n/a. Results were: n/a  Obstetric History OB History  Gravida Para Term Preterm AB Living  0 0 0 0 0 0  SAB IAB Ectopic Multiple Live Births  0 0 0 0 0    Past Medical History:  Diagnosis Date   Allergy    Asthma    Diabetes mellitus without complication (HCC)     Past Surgical History:  Procedure Laterality Date   osteomax2     OSTEOTOMY       Current Outpatient Medications:    ibuprofen (ADVIL) 800 MG tablet, Take 1 tablet (800 mg total) by mouth every 8 (eight) hours as needed., Disp: 30 tablet, Rfl: 5   azithromycin (ZITHROMAX) 500 MG tablet, Azithromycin 1 gram PO once. Please take with food (Patient not taking: Reported on 04/26/2020), Disp: 2 tablet, Rfl: 0   blood glucose meter kit and supplies, Dispense based on patient and insurance preference. Use up to four times daily as  directed. (FOR ICD-10 E10.9, E11.9). (Patient not taking: No sig reported), Disp: 1 each, Rfl: 0   doxycycline (VIBRAMYCIN) 100 MG capsule, Take 1 capsule (100 mg total) by mouth 2 (two) times daily., Disp: 28 capsule, Rfl: 0   fluconazole (DIFLUCAN) 200 MG tablet, Take 1 tablet (200 mg total) by mouth every 3 (three) days., Disp: 3 tablet, Rfl: 4   insulin aspart protamine - aspart (NOVOLOG 70/30 MIX) (70-30) 100 UNIT/ML FlexPen, Inject 0.16 mLs (16 Units total) into the skin 2 (two) times daily., Disp: 15 mL, Rfl: 11   medroxyPROGESTERone (DEPO-PROVERA) 150 MG/ML injection, ADMINISTER 1 ML(150 MG) IN THE MUSCLE EVERY 3 MONTHS, Disp: 1 mL, Rfl: 3   medroxyPROGESTERone (DEPO-PROVERA) 150 MG/ML injection, Inject 1 mL (150 mg total) into the muscle every 3 (three) months., Disp: 1 mL, Rfl: 2   metroNIDAZOLE (FLAGYL) 500 MG tablet, Take 1 tablet (500 mg total) by mouth 2 (two) times daily. (Patient not taking: Reported on 04/26/2020), Disp: 14 tablet, Rfl: 0   metroNIDAZOLE (FLAGYL) 500 MG tablet, Take 1 tablet (500 mg total) by mouth 2 (two) times daily., Disp: 28 tablet, Rfl: 0   norelgestromin-ethinyl estradiol (XULANE) 150-35 MCG/24HR transdermal patch, Place 1 patch onto the skin once a week. (Patient not taking: Reported on 04/26/2020), Disp: 3 patch, Rfl: 12  Current Facility-Administered Medications:    medroxyPROGESTERone (DEPO-PROVERA) injection 150 mg, 150 mg, Intramuscular, Q90 days, Brock Bad, MD, 150 mg at 06/05/18 [phone]  medroxyPROGESTERone (DEPO-PROVERA) injection 150 mg, 150 mg, Intramuscular, Q90 days, Bhambri, Melanie, CNM, 150 mg at 04/26/20 1000 No Known Allergies  Social History   Tobacco Use   Smoking status: Never   Smokeless tobacco: Never  Substance Use Topics   Alcohol use: No    Alcohol/week: 0.0 standard drinks of alcohol    Family History  Problem Relation Age of Onset   Diabetes Mother    Heart disease Mother    Hypertension Mother       Review of  Systems  Constitutional: negative for fatigue and weight loss Respiratory: negative for cough and wheezing Cardiovascular: negative for chest pain, fatigue and palpitations Gastrointestinal: negative for abdominal pain and change in bowel habits Musculoskeletal:negative for myalgias Neurological: negative for gait problems and tremors Behavioral/Psych: negative for abusive relationship, depression Endocrine: negative for temperature intolerance    Genitourinary: positive for vaginal discharge and pelvic pain.    negative for abnormal menstrual periods, genital lesions, hot flashes, sexual problems  Integument/breast: negative for breast lump, breast tenderness, nipple discharge and skin lesion(s)    Objective:       BP (!) 128/93 (BP Location: Right Arm, Cuff Size: Large)   Pulse (!) 132   Ht 5\' 6"  (1.676 m)   Wt 190 lb (86.2 kg)   LMP 06/01/2022 (Approximate)   BMI 30.67 kg/m  General:   Alert and no distress  Skin:   no rash or abnormalities  Lungs:   clear to auscultation bilaterally  Heart:   regular rate and rhythm, S1, S2 normal, no murmur, click, rub or gallop  Breasts:   normal without suspicious masses, skin or nipple changes or axillary nodes  Abdomen:  normal findings: no organomegaly, soft, non-tender and no hernia  Pelvis:  External genitalia: normal general appearance Urinary system: urethral meatus normal and bladder without fullness, nontender Vaginal: gray discharge and very tender to palpation Cervix: normal appearance Adnexa: bilateral tenderness, no masses Uterus: anteverted and tender to palpation   Lab Review Urine pregnancy test Labs reviewed yes Radiologic studies reviewed yes  I have spent a total of 30 minutes of face-to-face time, excluding clinical staff time, reviewing notes and preparing to see patient, ordering tests and/or medications, and counseling the patient.   Assessment:    1. Encounter for gynecological examination with Papanicolaou  smear of cervix Rx: - Cytology - PAP( [facility]) - CBC - TSH - Ferritin - Hemoglobin A1c - Comprehensive metabolic panel  2. Pelvic pain Rx: - doxycycline (VIBRAMYCIN) 100 MG capsule; Take 1 capsule (100 mg total) by mouth 2 (two) times daily.  Dispense: 28 capsule; Refill: 0 - metroNIDAZOLE (FLAGYL) 500 MG tablet; Take 1 tablet (500 mg total) by mouth 2 (two) times daily.  Dispense: 28 tablet; Refill: 0 - ketorolac (TORADOL) injection 60 mg - US PELVIC COMPLETE WITH TRANSVAGINAL; Future - ibuprofen (ADVIL) 800 MG tablet; Take 1 tablet (800 mg total) by mouth every 8 (eight) hours as needed.  Dispense: 30 tablet; Refill: 5  3. Vaginal discharge Rx: - Cervicovaginal ancillary only( [facility]) - fluconazole (DIFLUCAN) 200 MG tablet; Take 1 tablet (200 mg total) by mouth every 3 (three) days.  Dispense: 3 tablet; Refill: 4  4. Screen for STD (sexually transmitted disease) Rx: - Hepatitis C Antibody - HIV antibody (with reflex) - RPR - Hepatitis B Surface AntiGEN  5. Diabetes 1.5, managed as type 2 (HCC) - taking Metformin - managed by PCP  6. Obesity (BMI 30.0-34.9) - weight reduction recommended  Plan:    Education reviewed: calcium supplements, depression evaluation, low fat, low cholesterol diet, safe sex/STD prevention, self breast exams, and weight bearing exercise. Contraception: none. Follow up in: 1 year.   Meds ordered this encounter  Medications   DISCONTD: doxycycline (VIBRAMYCIN) 100 MG capsule    Sig: Take 1 capsule (100 mg total) by mouth 2 (two) times daily.    Dispense:  14 capsule    Refill:  0   DISCONTD: metroNIDAZOLE (FLAGYL) 500 MG tablet    Sig: Take 1 tablet (500 mg total) by mouth 2 (two) times daily.    Dispense:  14 tablet    Refill:  2   DISCONTD: fluconazole (DIFLUCAN) 200 MG tablet    Sig: Take 1 tablet (200 mg total) by mouth every 3 (three) days.    Dispense:  3 tablet    Refill:  2   doxycycline (VIBRAMYCIN) 100 MG capsule     Sig: Take 1 capsule (100 mg total) by mouth 2 (two) times daily.    Dispense:  28 capsule    Refill:  0   fluconazole (DIFLUCAN) 200 MG tablet    Sig: Take 1 tablet (200 mg total) by mouth every 3 (three) days.    Dispense:  3 tablet    Refill:  4   metroNIDAZOLE (FLAGYL) 500 MG tablet    Sig: Take 1 tablet (500 mg total) by mouth 2 (two) times daily.    Dispense:  28 tablet    Refill:  0   ketorolac (TORADOL) injection 60 mg   ibuprofen (ADVIL) 800 MG tablet    Sig: Take 1 tablet (800 mg total) by mouth every 8 (eight) hours as needed.    Dispense:  30 tablet    Refill:  5   Orders Placed This Encounter  Procedures   US PELVIC COMPLETE WITH TRANSVAGINAL    Standing Status:   Future    Standing Expiration Date:   06/19/2023    Order Specific Question:   Reason for Exam (SYMPTOM  OR DIAGNOSIS REQUIRED)    Answer:   Pelvic pain    Order Specific Question:   Preferred imaging location?    Answer:   [facility] Ultrasound   Hepatitis C Antibody   HIV antibody (with reflex)   RPR   Hepatitis B Surface AntiGEN   CBC   TSH   Ferritin   Hemoglobin A1c   Comprehensive metabolic panel     Brock Bad, MD 06/19/2022 11:33 AM

## 2022-06-19 NOTE — Progress Notes (Addendum)
27 y.o. GYN presents AEX/PAP/STD screening. C/o greenish yellow vaginal discharge, odor, pain 9/10, burning x 5 days.  Administrations This Visit     ketorolac (TORADOL) injection 60 mg     Admin Date 06/19/2022 Action Given Dose 60 mg Route Intramuscular Administered By Maretta Bees, RMA

## 2022-06-20 ENCOUNTER — Inpatient Hospital Stay (HOSPITAL_COMMUNITY): Payer: Medicaid Other

## 2022-06-20 ENCOUNTER — Emergency Department (HOSPITAL_COMMUNITY): Payer: Medicaid Other

## 2022-06-20 ENCOUNTER — Inpatient Hospital Stay (HOSPITAL_COMMUNITY)
Admission: EM | Admit: 2022-06-20 | Discharge: 2022-06-24 | DRG: 638 | Disposition: A | Discharge status: Home / Self Care | Payer: Medicaid Other | Attending: Internal Medicine | Admitting: Internal Medicine

## 2022-06-20 ENCOUNTER — Other Ambulatory Visit: Payer: Self-pay | Admitting: Obstetrics

## 2022-06-20 ENCOUNTER — Encounter (HOSPITAL_COMMUNITY): Payer: Self-pay

## 2022-06-20 DIAGNOSIS — E1111 Type 2 diabetes mellitus with ketoacidosis with coma: Secondary | ICD-10-CM | POA: Diagnosis not present

## 2022-06-20 DIAGNOSIS — N390 Urinary tract infection, site not specified: Secondary | ICD-10-CM | POA: Diagnosis present

## 2022-06-20 DIAGNOSIS — B3731 Acute candidiasis of vulva and vagina: Secondary | ICD-10-CM | POA: Diagnosis present

## 2022-06-20 DIAGNOSIS — Z8249 Family history of ischemic heart disease and other diseases of the circulatory system: Secondary | ICD-10-CM

## 2022-06-20 DIAGNOSIS — N179 Acute kidney failure, unspecified: Secondary | ICD-10-CM | POA: Diagnosis not present

## 2022-06-20 DIAGNOSIS — Z7984 Long term (current) use of oral hypoglycemic drugs: Secondary | ICD-10-CM | POA: Diagnosis not present

## 2022-06-20 DIAGNOSIS — E861 Hypovolemia: Secondary | ICD-10-CM | POA: Diagnosis present

## 2022-06-20 DIAGNOSIS — Z79899 Other long term (current) drug therapy: Secondary | ICD-10-CM

## 2022-06-20 DIAGNOSIS — E1110 Type 2 diabetes mellitus with ketoacidosis without coma: Secondary | ICD-10-CM | POA: Diagnosis present

## 2022-06-20 DIAGNOSIS — J45909 Unspecified asthma, uncomplicated: Secondary | ICD-10-CM | POA: Diagnosis present

## 2022-06-20 DIAGNOSIS — Z833 Family history of diabetes mellitus: Secondary | ICD-10-CM | POA: Diagnosis not present

## 2022-06-20 DIAGNOSIS — Z794 Long term (current) use of insulin: Secondary | ICD-10-CM | POA: Diagnosis not present

## 2022-06-20 DIAGNOSIS — B379 Candidiasis, unspecified: Secondary | ICD-10-CM

## 2022-06-20 DIAGNOSIS — D649 Anemia, unspecified: Secondary | ICD-10-CM | POA: Diagnosis present

## 2022-06-20 DIAGNOSIS — D72829 Elevated white blood cell count, unspecified: Secondary | ICD-10-CM | POA: Diagnosis not present

## 2022-06-20 DIAGNOSIS — Z91148 Patient's other noncompliance with medication regimen for other reason: Secondary | ICD-10-CM

## 2022-06-20 DIAGNOSIS — E871 Hypo-osmolality and hyponatremia: Secondary | ICD-10-CM | POA: Diagnosis not present

## 2022-06-20 DIAGNOSIS — E1310 Other specified diabetes mellitus with ketoacidosis without coma: Secondary | ICD-10-CM | POA: Diagnosis not present

## 2022-06-20 LAB — GLUCOSE, CAPILLARY
Glucose-Capillary: 228 mg/dL — ABNORMAL HIGH (ref 70–99)
Glucose-Capillary: 140 mg/dL — ABNORMAL HIGH (ref 70–99)

## 2022-06-20 LAB — CBC
HCT: 48.9 % — ABNORMAL HIGH (ref 36.0–46.0)
Hemoglobin: 14.7 g/dL (ref 12.0–15.0)
MCH: 28.2 pg (ref 26.0–34.0)
MCHC: 30.1 g/dL (ref 30.0–36.0)
MCV: 93.7 fL (ref 80.0–100.0)
Platelets: 306 10*3/uL (ref 150–400)
RBC: 5.22 MIL/uL — ABNORMAL HIGH (ref 3.87–5.11)
RDW: 13.3 % (ref 11.5–15.5)
WBC: 20.7 10*3/uL — ABNORMAL HIGH (ref 4.0–10.5)
nRBC: 0 % (ref 0.0–0.2)

## 2022-06-20 LAB — BLOOD GAS, VENOUS
Acid-base deficit: 25.8 mmol/L — ABNORMAL HIGH (ref 0.0–2.0)
Bicarbonate: 4.6 mmol/L — ABNORMAL LOW (ref 20.0–28.0)
O2 Saturation: 73.7 %
Patient temperature: 37
pCO2, Ven: 20 mmHg — ABNORMAL LOW (ref 44–60)
pH, Ven: 6.97 — CL (ref 7.25–7.43)
pO2, Ven: 46 mmHg — ABNORMAL HIGH (ref 32–45)

## 2022-06-20 LAB — URINALYSIS, ROUTINE W REFLEX MICROSCOPIC
Bilirubin Urine: NEGATIVE
Glucose, UA: >=500 mg/dL — AB
Ketones, ur: 80 mg/dL — AB
Nitrite: NEGATIVE
Protein, ur: 100 mg/dL — AB
Specific Gravity, Urine: 1.018 (ref 1.005–1.030)
pH: 5 (ref 5.0–8.0)

## 2022-06-20 LAB — I-STAT BETA HCG BLOOD, ED (MC, WL, AP ONLY): I-stat hCG, quantitative: <5 m[IU]/mL (ref <5)

## 2022-06-20 LAB — CERVICOVAGINAL ANCILLARY ONLY
Bacterial Vaginitis (gardnerella): POSITIVE — AB
Candida Glabrata: POSITIVE — AB
Candida Vaginitis: POSITIVE — AB
Chlamydia: NEGATIVE
Comment: NEGATIVE
Comment: NEGATIVE
Comment: NEGATIVE
Comment: NEGATIVE
Comment: NEGATIVE
Comment: NORMAL
Neisseria Gonorrhea: NEGATIVE
Trichomonas: NEGATIVE

## 2022-06-20 LAB — LIPASE, BLOOD: Lipase: 57 U/L — ABNORMAL HIGH (ref 11–51)

## 2022-06-20 LAB — RAPID URINE DRUG SCREEN, HOSP PERFORMED
Amphetamines: NOT DETECTED
Barbiturates: NOT DETECTED
Benzodiazepines: NOT DETECTED
Cocaine: NOT DETECTED
Opiates: NOT DETECTED
Tetrahydrocannabinol: NOT DETECTED

## 2022-06-20 LAB — BASIC METABOLIC PANEL
BUN: 24 mg/dL — ABNORMAL HIGH (ref 6–20)
CO2: <7 mmol/L — ABNORMAL LOW (ref 22–32)
Calcium: 8.7 mg/dL — ABNORMAL LOW (ref 8.9–10.3)
Chloride: 111 mmol/L (ref 98–111)
Creatinine, Ser: 1.08 mg/dL — ABNORMAL HIGH (ref 0.44–1.00)
GFR, Estimated: >60 mL/min (ref >60)
Glucose, Bld: 147 mg/dL — ABNORMAL HIGH (ref 70–99)
Potassium: 4.2 mmol/L (ref 3.5–5.1)
Sodium: 137 mmol/L (ref 135–145)

## 2022-06-20 LAB — COMPREHENSIVE METABOLIC PANEL
ALT: 14 U/L (ref 0–44)
AST: 11 U/L — ABNORMAL LOW (ref 15–41)
Albumin: 3.8 g/dL (ref 3.5–5.0)
Alkaline Phosphatase: 82 U/L (ref 38–126)
BUN: 26 mg/dL — ABNORMAL HIGH (ref 6–20)
CO2: <7 mmol/L — ABNORMAL LOW (ref 22–32)
Calcium: 8.6 mg/dL — ABNORMAL LOW (ref 8.9–10.3)
Chloride: 104 mmol/L (ref 98–111)
Creatinine, Ser: 1.12 mg/dL — ABNORMAL HIGH (ref 0.44–1.00)
GFR, Estimated: >60 mL/min (ref >60)
Glucose, Bld: 447 mg/dL — ABNORMAL HIGH (ref 70–99)
Potassium: 4.5 mmol/L (ref 3.5–5.1)
Sodium: 131 mmol/L — ABNORMAL LOW (ref 135–145)
Total Bilirubin: 1.4 mg/dL — ABNORMAL HIGH (ref 0.3–1.2)
Total Protein: 9.1 g/dL — ABNORMAL HIGH (ref 6.5–8.1)

## 2022-06-20 LAB — CBG MONITORING, ED
Glucose-Capillary: 415 mg/dL — ABNORMAL HIGH (ref 70–99)
Glucose-Capillary: 437 mg/dL — ABNORMAL HIGH (ref 70–99)
Glucose-Capillary: 395 mg/dL — ABNORMAL HIGH (ref 70–99)
Glucose-Capillary: 336 mg/dL — ABNORMAL HIGH (ref 70–99)

## 2022-06-20 LAB — ACETAMINOPHEN LEVEL: Acetaminophen (Tylenol), Serum: <10 ug/mL — ABNORMAL LOW (ref 10–30)

## 2022-06-20 LAB — MAGNESIUM: Magnesium: 2.4 mg/dL (ref 1.7–2.4)

## 2022-06-20 LAB — ETHANOL: Alcohol, Ethyl (B): <10 mg/dL (ref <10)

## 2022-06-20 LAB — MRSA NEXT GEN BY PCR, NASAL: MRSA by PCR Next Gen: NOT DETECTED

## 2022-06-20 LAB — BETA-HYDROXYBUTYRIC ACID: Beta-Hydroxybutyric Acid: >8 mmol/L — ABNORMAL HIGH (ref 0.05–0.27)

## 2022-06-20 MED ORDER — INSULIN REGULAR(HUMAN) IN NACL 100-0.9 UT/100ML-% IV SOLN
INTRAVENOUS | Status: DC
  Administered 2022-06-20: 15 [IU]/h via INTRAVENOUS
  Administered 2022-06-21: 4 [IU]/h via INTRAVENOUS
  Administered 2022-06-22: 4.8 [IU]/h via INTRAVENOUS
  Filled 2022-06-20 (×2): qty 100

## 2022-06-20 MED ORDER — DEXTROSE 50 % IV SOLN: 0.0000 mL | INTRAVENOUS | Status: DC | PRN

## 2022-06-20 MED ORDER — LACTATED RINGERS IV BOLUS
1000.0000 mL | Freq: Once | INTRAVENOUS | Status: AC
  Administered 2022-06-20: 1000 mL via INTRAVENOUS

## 2022-06-20 MED ORDER — LACTATED RINGERS IV SOLN: INTRAVENOUS | Status: DC

## 2022-06-20 MED ORDER — DEXTROSE IN LACTATED RINGERS 5 % IV SOLN: INTRAVENOUS | Status: DC

## 2022-06-20 MED ORDER — CHLORHEXIDINE GLUCONATE CLOTH 2 % EX PADS
6.0000 | MEDICATED_PAD | Freq: Every day | CUTANEOUS | Status: DC
  Administered 2022-06-20 – 2022-06-24 (×5): 6 via TOPICAL

## 2022-06-20 MED ORDER — AZO BORIC ACID 600 MG VA SUPP
1.0000 | Freq: Every day | VAGINAL | 0 refills | Status: DC
Start: 2022-06-20 — End: 2022-06-20

## 2022-06-20 MED ORDER — FLUCONAZOLE 150 MG PO TABS
150.0000 mg | ORAL_TABLET | Freq: Once | ORAL | Status: AC
  Administered 2022-06-21: 150 mg via ORAL
  Filled 2022-06-20: qty 1

## 2022-06-20 MED ORDER — HEPARIN SODIUM (PORCINE) 5000 UNIT/ML IJ SOLN
5000.0000 [IU] | Freq: Three times a day (TID) | INTRAMUSCULAR | Status: DC
  Administered 2022-06-20 – 2022-06-24 (×10): 5000 [IU] via SUBCUTANEOUS
  Filled 2022-06-20 (×10): qty 1

## 2022-06-20 MED ORDER — LACTATED RINGERS IV BOLUS
500.0000 mL | Freq: Once | INTRAVENOUS | Status: AC
  Administered 2022-06-20: 500 mL via INTRAVENOUS

## 2022-06-20 MED ORDER — POTASSIUM CHLORIDE 10 MEQ/100ML IV SOLN
10.0000 meq | INTRAVENOUS | Status: AC
  Administered 2022-06-20 – 2022-06-21 (×2): 10 meq via INTRAVENOUS
  Filled 2022-06-20 (×2): qty 100

## 2022-06-20 MED ORDER — FLUCONAZOLE 150 MG PO TABS: 150.0000 mg | ORAL_TABLET | Freq: Once | ORAL | Status: DC

## 2022-06-20 MED ORDER — METRONIDAZOLE 500 MG PO TABS
500.0000 mg | ORAL_TABLET | Freq: Two times a day (BID) | ORAL | Status: DC
  Administered 2022-06-20 – 2022-06-24 (×8): 500 mg via ORAL
  Filled 2022-06-20 (×8): qty 1

## 2022-06-20 MED ORDER — SODIUM CHLORIDE 0.9 % IV SOLN: INTRAVENOUS | Status: DC | PRN

## 2022-06-20 NOTE — ED Notes (Signed)
Urine culture sent to main lab.  

## 2022-06-20 NOTE — Progress Notes (Signed)
eLink Physician-Brief Progress Note Patient Name: Amanda Kent DOB: 12-21-1995 MRN: [id number]   Date of Service  06/20/2022  HPI/Events of Note  27/F presenting with AMS, found to be in DKA. Started on fluids, insulin, and admitted to the ICU.  Pt seen in the ICU, remains confused. In sinus tachycardia.   eICU Interventions  Maintain on insulin drip, Will plan to transition to  insulin when gap has closed and glucoses remain controlled.  Continue IVF. Will switch to D5 containing fluids when glucose drops below 250.  Follow serial BMP.  Plan to replete K as wararnted.  Startd on empiric antibiotics - will follow WBC, cultures and deesacalate as warranted.          [name] 06/20/2022, 9:20 PM

## 2022-06-20 NOTE — ED Provider Notes (Addendum)
[place] EMERGENCY DEPARTMENT AT [facility] Provider Note   CSN: [id number] Arrival date & time: 06/20/22  1718     History  Chief Complaint  Patient presents with   Altered Mental Status    Amanda Kent is a 27 y.o. female w/ history T2DM presenting with altered mental status.  Mom is at the bedside, reports she was last known well around 12 PM yesterday.  After they went to lunch yesterday, she noted that she was not acting like herself.  Around 3 AM this morning, she received a phone call from [facility] that she was outside.  She was complaining of shortness of breath and she was "not acting like herself." Mom reports that she is unsure if Jernie had consumed more alcohol than usual, or took "pills" which has been a problem in the past.      Home Medications Prior to Admission medications   Medication Sig Start Date End Date Taking? Authorizing Provider  doxycycline (VIBRAMYCIN) 100 MG capsule Take 1 capsule (100 mg total) by mouth 2 (two) times daily. 06/19/22  Yes Brock Bad, MD  fluconazole (DIFLUCAN) 200 MG tablet Take 1 tablet (200 mg total) by mouth every 3 (three) days. 06/19/22  Yes Brock Bad, MD  ibuprofen (ADVIL) 800 MG tablet Take 1 tablet (800 mg total) by mouth every 8 (eight) hours as needed. 06/19/22  Yes Brock Bad, MD  metroNIDAZOLE (FLAGYL) 500 MG tablet Take 1 tablet (500 mg total) by mouth 2 (two) times daily. 06/19/22  Yes Brock Bad, MD  blood glucose meter kit and supplies Dispense based on patient and insurance preference. Use up to four times daily as directed. (FOR ICD-10 E10.9, E11.9). Patient not taking: No sig reported 01/27/18   Regalado, Belkys A, MD  metroNIDAZOLE (FLAGYL) 500 MG tablet Take 1 tablet (500 mg total) by mouth 2 (two) times daily. Patient not taking: Reported on 04/26/2020 02/04/20   Donette Larry, CNM      Allergies    Patient has no known allergies.    Review of Systems   Review of  Systems  Unable to perform ROS: Mental status change    Physical Exam Updated Vital Signs BP (!) 143/104 (BP Location: Left Arm)   Pulse (!) 140   Temp 98.4 F (36.9 C) (Oral)   Resp (!) 38   LMP 06/01/2022 (Approximate)   SpO2 99%  Physical Exam Constitutional:      Comments: Lethargic  HENT:     Mouth/Throat:     Mouth: Mucous membranes are moist.  Eyes:     Comments: Pupils equal, sluggish to react   Cardiovascular:     Rate and Rhythm: Regular rhythm. Tachycardia present.     Comments: HR 140s Pulmonary:     Breath sounds: Normal breath sounds.     Comments: Tachypneic Abdominal:     Palpations: Abdomen is soft.     Tenderness: There is no abdominal tenderness.  Musculoskeletal:     Cervical back: Normal range of motion.  Skin:    General: Skin is warm.  Neurological:     Mental Status: She is disoriented.     ED Results / Procedures / Treatments   Labs (all labs ordered are listed, but only abnormal results are displayed) Labs Reviewed  BLOOD GAS, VENOUS - Abnormal; Notable for the following components:      Result Value   pH, Ven 6.97 (*)    pCO2, Ven 20 (*)  pO2, Ven 46 (*)    Bicarbonate 4.6 (*)    Acid-base deficit 25.8 (*)    All other components within normal limits  LIPASE, BLOOD - Abnormal; Notable for the following components:   Lipase 57 (*)    All other components within normal limits  CBC - Abnormal; Notable for the following components:   WBC 20.7 (*)    RBC 5.22 (*)    HCT 48.9 (*)    All other components within normal limits  COMPREHENSIVE METABOLIC PANEL - Abnormal; Notable for the following components:   Sodium 131 (*)    CO2 <7 (*)    Glucose, Bld 447 (*)    BUN 26 (*)    Creatinine, Ser 1.12 (*)    Calcium 8.6 (*)    Total Protein 9.1 (*)    AST 11 (*)    Total Bilirubin 1.4 (*)    All other components within normal limits  ACETAMINOPHEN LEVEL - Abnormal; Notable for the following components:   Acetaminophen (Tylenol),  Serum <10 (*)    All other components within normal limits  CBG MONITORING, ED - Abnormal; Notable for the following components:   Glucose-Capillary 415 (*)    All other components within normal limits  CBG MONITORING, ED - Abnormal; Notable for the following components:   Glucose-Capillary 437 (*)    All other components within normal limits  MAGNESIUM  ETHANOL  KETONES, URINE  RAPID URINE DRUG SCREEN, HOSP PERFORMED  BASIC METABOLIC PANEL  BASIC METABOLIC PANEL  BASIC METABOLIC PANEL  BETA-HYDROXYBUTYRIC ACID  BETA-HYDROXYBUTYRIC ACID  I-STAT BETA HCG BLOOD, ED ([facility], [facility], [facility] ONLY)    EKG EKG Interpretation  Date/Time:  Thursday June 20 2022 17:28:13 EDT Ventricular Rate:  140 PR Interval:  134 QRS Duration: 82 QT Interval:  291 QTC Calculation: 441 R Axis:   63 Text Interpretation: Sinus tachycardia Atrial premature complex Borderline T abnormalities, inferior leads Baseline wander in lead(s) V3 V4 Confirmed by Coralee Pesa [phone]) on 06/20/2022 5:30:28 PM  Radiology DG Chest Port 1 View  Result Date: 06/20/2022 CLINICAL DATA:  Shortness of breath EXAM: PORTABLE CHEST 1 VIEW COMPARISON:  X-ray 01/26/2018 FINDINGS: Underinflation. No consolidation, pneumothorax or effusion. No edema. Normal cardiopericardial silhouette. Overlapping cardiac leads. IMPRESSION: No acute cardiopulmonary disease. Electronically Signed   By: Karen Kays M.D.   On: 06/20/2022 18:57    Procedures Procedures    Medications Ordered in ED Medications  heparin injection 5,000 Units (has no administration in time range)  insulin regular, human (MYXREDLIN) 100 units/ 100 mL infusion (15 Units/hr Intravenous New Bag/Given 06/20/22 1926)  lactated ringers infusion (has no administration in time range)  dextrose 5 % in lactated ringers infusion (has no administration in time range)  dextrose 50 % solution 0-50 mL (has no administration in time range)  potassium chloride 10 mEq in 100 mL IVPB (has no  administration in time range)  lactated ringers bolus 500 mL (500 mLs Intravenous New Bag/Given 06/20/22 1810)  lactated ringers bolus 1,000 mL (1,000 mLs Intravenous New Bag/Given 06/20/22 1909)    ED Course/ Medical Decision Making/ A&P                             Medical Decision Making 27 y/o F with altered mental status secondary to DKA in setting of uncontrolled type 2 DM with last A1c 11.9%. Tachycardic and hypertensive on presentation with lethargy and Kussmaul breathing, and would awaken  to voice. Only oriented to self, location but not year. Labs remarkable for pH 6.9, hyperglycemia in the 400s. CXR without sign of infection, although positive for BV and candidal vaginosis at gynecology visit yesterday. Leukocytosis to 20k. Received IV fluid resuscitation with 1.5 L LR, IV insulin. Given acuity of illness, discussed with critical care medicine who agrees to admit patient to ICU overnight with transfer to floor when appropriate.  Amount and/or Complexity of Data Reviewed Labs: ordered. Radiology: ordered.  Risk Prescription drug management. Decision regarding hospitalization.          Final Clinical Impression(s) / ED Diagnoses Final diagnoses:  None    Rx / DC Orders ED Discharge Orders     None         Darral Dash, DO 06/20/22 1941    Darral Dash, DO 06/20/22 1942    Horton, Clabe Seal, DO 06/20/22 1951

## 2022-06-20 NOTE — H&P (Addendum)
NAME:  Amanda Kent, MRN:  [id number], DOB:  03-Jun-1995, LOS: 0 ADMISSION DATE:  06/20/2022, CONSULTATION DATE:  06/20/22 REFERRING MD:  EDP, CHIEF COMPLAINT:  SOB   History of Present Illness:  27 year old woman with a history of diabetes, non-insulin-dependent at present presenting with 1 to 2 days of worsening shortness of breath, nausea vomiting.  Yesterday went to her gynecologist with pelvic pain and was diagnosed with bacterial vaginosis as well as Candida vaginitis.  In the ER found to have a VBG less than 7, hyperglycemia, elevated anion gap.  Diagnosis is DKA and PCCM is consulted for admission.  Pertinent  Medical History  Asthma Diabetes  Significant Hospital Events: Including procedures, antibiotic start and stop dates in addition to other pertinent events   6/13 admit  Interim History / Subjective:  Admit, review of systems as below  Objective   Blood pressure 104/80, pulse (!) 148, temperature 98.4 F (36.9 C), temperature source Oral, resp. rate 17, last menstrual period 06/01/2022, SpO2 100 %.       No intake or output data in the 24 hours ending 06/20/22 1955 There were no vitals filed for this visit.  Examination: Laying on her side in bed in no acute distress Mild tachypnea noted Heart sounds are tachycardic Extensive acne vulgaris noted Abdomen soft, nontender, nondistended, no suprapubic tenderness Lungs are clear, no accessory muscle use No edema, moves all 4 extremities to command, alert and oriented x 3  Resolved Hospital Problem list   Not applicable  Assessment & Plan:  DKA in the setting of inability to afford Trulicity.  Home regimen consists of metformin alone.  GU issues likely secondary to poorly controlled diabetes.  Bacterial and fungal vaginitis  Prerenal acute kidney injury  Pseudohyponatremia  Globulin gap, STD testing from yesterday will be followed up  Leukocytosis- hopefully just reactive  ICU admission for this  evening, usual DKA pathway with aggressive crystalloid IV administration as well as insulin drip  Frequent BMP monitoring and repletion of K as needed  Check pelvic ultrasound  F/u ua/ urine culture, follow fever and WBC curve  Flagyl + fluconazole (latter for 1 doses)  F/u STD testing  Will ask if TRH can take over starting at 7 AM given fairly benign presentation  Diabetes education consult to see what best/cheapest option to optimize glycemic control  Best Practice (right click and "Reselect all SmartList Selections" daily)   Diet/type: NPO DVT prophylaxis: prophylactic heparin  GI prophylaxis: N/A Lines: N/A Foley:  N/A Code Status:  full code Last date of multidisciplinary goals of care discussion [updated her and family at bedside]  Labs   CBC: Recent Labs  Lab 06/20/22 1735  WBC 20.7*  HGB 14.7  HCT 48.9*  MCV 93.7  PLT 306    Basic Metabolic Panel: Recent Labs  Lab 06/20/22 1735  NA 131*  K 4.5  CL 104  CO2 <7*  GLUCOSE 447*  BUN 26*  CREATININE 1.12*  CALCIUM 8.6*  MG 2.4   GFR: Estimated Creatinine Clearance: 82.8 mL/min (A) (by C-G formula based on SCr of 1.12 mg/dL (H)). Recent Labs  Lab 06/20/22 1735  WBC 20.7*    Liver Function Tests: Recent Labs  Lab 06/20/22 1735  AST 11*  ALT 14  ALKPHOS 82  BILITOT 1.4*  PROT 9.1*  ALBUMIN 3.8   Recent Labs  Lab 06/20/22 1735  LIPASE 57*   No results for input(s): "AMMONIA" in the last 168 hours.  ABG  Component Value Date/Time   HCO3 4.6 (L) 06/20/2022 1759   ACIDBASEDEF 25.8 (H) 06/20/2022 1759   O2SAT 73.7 06/20/2022 1759     Coagulation Profile: No results for input(s): "INR", "PROTIME" in the last 168 hours.  Cardiac Enzymes: No results for input(s): "CKTOTAL", "CKMB", "CKMBINDEX", "TROPONINI" in the last 168 hours.  HbA1C: Hgb A1c MFr Bld  Date/Time Value Ref Range Status  04/23/2018 11:37 AM 11.0 (H) 4.8 - 5.6 % Final    Comment:             Prediabetes: 5.7 -  6.4          Diabetes: >6.4          Glycemic control for adults with diabetes: <7.0   01/26/2018 03:38 AM 12.8 (H) 4.8 - 5.6 % Final    Comment:    (NOTE) Pre diabetes:          5.7%-6.4% Diabetes:              >6.4% Glycemic control for   <7.0% adults with diabetes     CBG: Recent Labs  Lab 06/20/22 1724 06/20/22 1921  GLUCAP 415* 437*    Review of Systems:    Positive Symptoms in bold:  Constitutional fevers, chills, weight loss, fatigue, anorexia, malaise  Eyes decreased vision, double vision, eye irritation  Ears, Nose, Mouth, Throat sore throat, trouble swallowing, sinus congestion  Cardiovascular chest pain, paroxysmal nocturnal dyspnea, lower ext edema, palpitations   Respiratory SOB, cough, DOE, hemoptysis, wheezing  Gastrointestinal nausea, vomiting, diarrhea  Genitourinary burning with urination, trouble urinating  Musculoskeletal joint aches, joint swelling, back pain  Integumentary  rashes, skin lesions  Neurological focal weakness, focal numbness, trouble speaking, headaches  Psychiatric depression, anxiety, confusion  Endocrine polyuria, polydipsia, cold intolerance, heat intolerance  Hematologic abnormal bruising, abnormal bleeding, unexplained nose bleeds  Allergic/Immunologic recurrent infections, hives, swollen lymph nodes     Past Medical History:  She,  has a past medical history of Allergy, Asthma, and Diabetes mellitus without complication (HCC).   Surgical History:   Past Surgical History:  Procedure Laterality Date   osteomax2     OSTEOTOMY       Social History:   reports that she has never smoked. She has never used smokeless tobacco. She reports that she does not drink alcohol and does not use drugs.   Family History:  Her family history includes Diabetes in her mother; Heart disease in her mother; Hypertension in her mother.   Allergies No Known Allergies   Home Medications  Prior to Admission medications   Medication Sig  Start Date End Date Taking? Authorizing Provider  doxycycline (VIBRAMYCIN) 100 MG capsule Take 1 capsule (100 mg total) by mouth 2 (two) times daily. 06/19/22  Yes Brock Bad, MD  fluconazole (DIFLUCAN) 200 MG tablet Take 1 tablet (200 mg total) by mouth every 3 (three) days. 06/19/22  Yes Brock Bad, MD  ibuprofen (ADVIL) 800 MG tablet Take 1 tablet (800 mg total) by mouth every 8 (eight) hours as needed. 06/19/22  Yes Brock Bad, MD  metroNIDAZOLE (FLAGYL) 500 MG tablet Take 1 tablet (500 mg total) by mouth 2 (two) times daily. 06/19/22  Yes Brock Bad, MD  blood glucose meter kit and supplies Dispense based on patient and insurance preference. Use up to four times daily as directed. (FOR ICD-10 E10.9, E11.9). Patient not taking: No sig reported 01/27/18   Regalado, Belkys A, MD  metroNIDAZOLE (FLAGYL) 500 MG  tablet Take 1 tablet (500 mg total) by mouth 2 (two) times daily. Patient not taking: Reported on 04/26/2020 02/04/20   Donette Larry, CNM     Critical care time: N/A

## 2022-06-20 NOTE — ED Notes (Signed)
ED TO INPATIENT HANDOFF REPORT  Name/Age/Gender Amanda Kent 27 y.o. female  Code Status    Code Status Orders  (From admission, onward)           Start     Ordered   06/20/22 1905  Full code  Continuous       Question:  By:  Answer:  Other   06/20/22 1910           Code Status History     Date Active Date Inactive Code Status Order ID Comments User Context   01/26/2018 0211 01/27/2018 1343 Full Code [id number]  Eduard Clos, MD ED       Home/SNF/Other Home  Chief Complaint DKA (diabetic ketoacidosis) (HCC) [E11.10]  Level of Care/Admitting Diagnosis ED Disposition     ED Disposition  Admit   Condition  --   Comment  Hospital Area: [facility] [[id number]]  Level of Care: ICU [6]  May admit patient to [facility] or [facility] if equivalent level of care is available:: Yes  Covid Evaluation: Asymptomatic - no recent exposure (last 10 days) testing not required  Diagnosis: DKA (diabetic ketoacidosis) [facility]) [[id number]]  Admitting Physician: Lorin Glass [[id number]]  Attending Physician: Lorin Glass [[id number]]  Certification:: I certify this patient will need inpatient services for at least 2 midnights  Estimated Length of Stay: 5          Medical History Past Medical History:  Diagnosis Date   Allergy    Asthma    Diabetes mellitus without complication (HCC)     Allergies No Known Allergies  IV Location/Drains/Wounds Patient Lines/Drains/Airways Status     Active Line/Drains/Airways     Name Placement date Placement time Site Days   Peripheral IV 06/20/22 20 G Right;Posterior Hand 06/20/22  1808  Hand  less than 1            Labs/Imaging Results for orders placed or performed during the hospital encounter of 06/20/22 (from the past 48 hour(s))  CBG monitoring, ED     Status: Abnormal   Collection Time: 06/20/22  5:24 PM  Result Value Ref Range   Glucose-Capillary 415 (H) 70 - 99 mg/dL    Comment:  Glucose reference range applies only to samples taken after fasting for at least 8 hours.  Lipase, blood     Status: Abnormal   Collection Time: 06/20/22  5:35 PM  Result Value Ref Range   Lipase 57 (H) 11 - 51 U/L    Comment: Performed at [facility], [address] [place], [place] [address]  CBC     Status: Abnormal   Collection Time: 06/20/22  5:35 PM  Result Value Ref Range   WBC 20.7 (H) 4.0 - 10.5 K/uL   RBC 5.22 (H) 3.87 - 5.11 MIL/uL   Hemoglobin 14.7 12.0 - 15.0 g/dL   HCT 28.4 (H) 13.2 - 44.0 %   MCV 93.7 80.0 - 100.0 fL   MCH 28.2 26.0 - 34.0 pg   MCHC 30.1 30.0 - 36.0 g/dL   RDW 10.2 72.5 - 36.6 %   Platelets 306 150 - 400 K/uL   nRBC 0.0 0.0 - 0.2 %    Comment: Performed at [facility], [address], [place], [place] [address]  Comprehensive metabolic panel     Status: Abnormal   Collection Time: 06/20/22  5:35 PM  Result Value Ref Range   Sodium 131 (L) 135 - 145  mmol/L   Potassium 4.5 3.5 - 5.1 mmol/L   Chloride 104 98 - 111 mmol/L   CO2 <7 (L) 22 - 32 mmol/L   Glucose, Bld 447 (H) 70 - 99 mg/dL    Comment: Glucose reference range applies only to samples taken after fasting for at least 8 hours.   BUN 26 (H) 6 - 20 mg/dL   Creatinine, Ser 1.61 (H) 0.44 - 1.00 mg/dL   Calcium 8.6 (L) 8.9 - 10.3 mg/dL   Total Protein 9.1 (H) 6.5 - 8.1 g/dL   Albumin 3.8 3.5 - 5.0 g/dL   AST 11 (L) 15 - 41 U/L   ALT 14 0 - 44 U/L   Alkaline Phosphatase 82 38 - 126 U/L   Total Bilirubin 1.4 (H) 0.3 - 1.2 mg/dL   GFR, Estimated >09 >60 mL/min    Comment: (NOTE) Calculated using the CKD-EPI Creatinine Equation (2021)    Anion gap NOT CALCULATED 5 - 15    Comment: Performed at [facility], [address] [place], [place] [address]  Magnesium     Status: None   Collection Time: 06/20/22  5:35 PM  Result Value Ref Range   Magnesium 2.4 1.7 - 2.4 mg/dL    Comment: Performed at [facility], [address], [place], [place] [address]  Ethanol     Status: None   Collection Time: 06/20/22  5:35 PM  Result Value Ref Range   Alcohol, Ethyl (B) <10 <10 mg/dL    Comment: (NOTE) Lowest detectable limit for serum alcohol is 10 mg/dL.  For medical purposes only. Performed at [facility], [address], [place], [place] [address]   Acetaminophen level     Status: Abnormal   Collection Time: 06/20/22  5:35 PM  Result Value Ref Range   Acetaminophen (Tylenol), Serum <10 (L) 10 - 30 ug/mL    Comment: (NOTE) Therapeutic concentrations vary significantly. A range of 10-30 ug/mL  may be an effective concentration for many patients. However, some  are best treated at concentrations outside of this range. Acetaminophen concentrations >150 ug/mL at 4 hours after ingestion  and >50 ug/mL at 12 hours after ingestion are often associated with  toxic reactions.  Performed at [facility], [address] [place], [place] [address]   Blood gas, venous     Status: Abnormal   Collection Time: 06/20/22  5:59 PM  Result Value Ref Range   pH, Ven 6.97 (LL) 7.25 - 7.43    Comment: CRITICAL RESULT CALLED TO, READ BACK BY AND VERIFIED WITH: I.CORTES, RN AT 1835 ON 06.13.24 BY N.THOMPSON    pCO2, Ven 20 (L) 44 - 60 mmHg   pO2, Ven 46 (H) 32 - 45 mmHg   Bicarbonate 4.6 (L) 20.0 - 28.0 mmol/L   Acid-base deficit 25.8 (H) 0.0 - 2.0 mmol/L   O2 Saturation 73.7 %   Patient temperature 37.0     Comment: Performed at [facility], [address], [place], [place] [address]  I-Stat beta hCG blood, ED ([facility], [facility], [facility] only)     Status: None   Collection Time: 06/20/22  6:32 PM  Result Value Ref Range   I-stat hCG, quantitative <5.0 <5 mIU/mL   Comment 3            Comment:   GEST. AGE      CONC.  (mIU/mL)   <=1 WEEK        5 -  50     2 WEEKS       50 - 500     3 WEEKS       100 - 10,000     4 WEEKS     1,000 - 30,000        FEMALE AND NON-PREGNANT  FEMALE:     LESS THAN 5 mIU/mL   CBG monitoring, ED     Status: Abnormal   Collection Time: 06/20/22  7:21 PM  Result Value Ref Range   Glucose-Capillary 437 (H) 70 - 99 mg/dL    Comment: Glucose reference range applies only to samples taken after fasting for at least 8 hours.  CBG monitoring, ED     Status: Abnormal   Collection Time: 06/20/22  7:55 PM  Result Value Ref Range   Glucose-Capillary 395 (H) 70 - 99 mg/dL    Comment: Glucose reference range applies only to samples taken after fasting for at least 8 hours.  Rapid urine drug screen (hospital performed)     Status: None   Collection Time: 06/20/22  7:56 PM  Result Value Ref Range   Opiates NONE DETECTED NONE DETECTED   Cocaine NONE DETECTED NONE DETECTED   Benzodiazepines NONE DETECTED NONE DETECTED   Amphetamines NONE DETECTED NONE DETECTED   Tetrahydrocannabinol NONE DETECTED NONE DETECTED   Barbiturates NONE DETECTED NONE DETECTED    Comment: (NOTE) DRUG SCREEN FOR MEDICAL PURPOSES ONLY.  IF CONFIRMATION IS NEEDED FOR ANY PURPOSE, NOTIFY LAB WITHIN 5 DAYS.  LOWEST DETECTABLE LIMITS FOR URINE DRUG SCREEN Drug Class                     Cutoff (ng/mL) Amphetamine and metabolites    1000 Barbiturate and metabolites    200 Benzodiazepine                 200 Opiates and metabolites        300 Cocaine and metabolites        300 THC                            50 Performed at [facility], [address], [place], [place] [address]   Urinalysis, Routine w reflex microscopic -Urine, Clean Catch     Status: Abnormal   Collection Time: 06/20/22  7:56 PM  Result Value Ref Range   Color, Urine YELLOW YELLOW   APPearance HAZY (A) CLEAR   Specific Gravity, Urine 1.018 1.005 - 1.030   pH 5.0 5.0 - 8.0   Glucose, UA >=500 (A) NEGATIVE mg/dL   Hgb urine dipstick MODERATE (A) NEGATIVE   Bilirubin Urine NEGATIVE NEGATIVE   Ketones, ur 80 (A) NEGATIVE mg/dL   Protein, ur 604 (A) NEGATIVE mg/dL   Nitrite  NEGATIVE NEGATIVE   Leukocytes,Ua SMALL (A) NEGATIVE   RBC / HPF 11-20 0 - 5 RBC/hpf   WBC, UA 21-50 0 - 5 WBC/hpf   Bacteria, UA RARE (A) NONE SEEN   Squamous Epithelial / HPF 0-5 0 - 5 /HPF   Mucus PRESENT    Hyaline Casts, UA PRESENT     Comment: Performed at [facility], [address], [place], [place] [address]   DG Chest Port 1 View  Result Date: 06/20/2022 CLINICAL DATA:  Shortness of breath EXAM: PORTABLE CHEST 1 VIEW COMPARISON:  X-ray 01/26/2018 FINDINGS: Underinflation. No consolidation, pneumothorax or effusion. No edema. Normal cardiopericardial silhouette. Overlapping  cardiac leads. IMPRESSION: No acute cardiopulmonary disease. Electronically Signed   By: Karen Kays M.D.   On: 06/20/2022 18:57    Pending Labs Unresulted Labs (From admission, onward)     Start     Ordered   06/20/22 2200  Basic metabolic panel  (Diabetes Ketoacidosis (DKA))  STAT Now then every 4 hours ,   R (with STAT occurrences)      06/20/22 1910   06/20/22 1908  Beta-hydroxybutyric acid  (Diabetes Ketoacidosis (DKA))  Now then every 8 hours,   R (with TIMED occurrences)      06/20/22 1910            Vitals/Pain Today's Vitals   06/20/22 1745 06/20/22 1915 06/20/22 1930 06/20/22 2019  BP: (!) 120/102 (!) 145/82 104/80 119/61  Pulse: (!) 140 (!) 139 (!) 148 (!) 143  Resp: (!) 39 (!) 40 17 (!) 30  Temp:      TempSrc:      SpO2: 98% 100% 100% 99%  PainSc:        Isolation Precautions No active isolations  Medications Medications  heparin injection 5,000 Units (has no administration in time range)  insulin regular, human (MYXREDLIN) 100 units/ 100 mL infusion (15 Units/hr Intravenous New Bag/Given 06/20/22 1926)  lactated ringers infusion (has no administration in time range)  dextrose 5 % in lactated ringers infusion (has no administration in time range)  dextrose 50 % solution 0-50 mL (has no administration in time range)  potassium chloride 10 mEq in 100 mL IVPB  (has no administration in time range)  metroNIDAZOLE (FLAGYL) tablet 500 mg (has no administration in time range)  lactated ringers bolus 500 mL (500 mLs Intravenous New Bag/Given 06/20/22 1810)  lactated ringers bolus 1,000 mL (1,000 mLs Intravenous New Bag/Given 06/20/22 1909)    Mobility walks with person assist

## 2022-06-20 NOTE — ED Triage Notes (Signed)
Per visitor at bedside, pt has been more altered and incoherent over the last couple of days. Pt has been fatigued as well. Pt is a diabetic. Pt reports some difficulty breathing upon arrival to ER.

## 2022-06-21 ENCOUNTER — Other Ambulatory Visit: Payer: Self-pay

## 2022-06-21 ENCOUNTER — Inpatient Hospital Stay (HOSPITAL_COMMUNITY): Payer: Medicaid Other

## 2022-06-21 DIAGNOSIS — E1111 Type 2 diabetes mellitus with ketoacidosis with coma: Secondary | ICD-10-CM

## 2022-06-21 DIAGNOSIS — D72829 Elevated white blood cell count, unspecified: Secondary | ICD-10-CM

## 2022-06-21 DIAGNOSIS — N179 Acute kidney failure, unspecified: Secondary | ICD-10-CM

## 2022-06-21 LAB — CBC
HCT: 47.6 % — ABNORMAL HIGH (ref 36.0–46.0)
Hemoglobin: 14.6 g/dL (ref 12.0–15.0)
MCH: 28.3 pg (ref 26.0–34.0)
MCHC: 30.7 g/dL (ref 30.0–36.0)
MCV: 92.4 fL (ref 80.0–100.0)
Platelets: 265 10*3/uL (ref 150–400)
RBC: 5.15 MIL/uL — ABNORMAL HIGH (ref 3.87–5.11)
RDW: 13.2 % (ref 11.5–15.5)
WBC: 21.9 10*3/uL — ABNORMAL HIGH (ref 4.0–10.5)
nRBC: 0 % (ref 0.0–0.2)

## 2022-06-21 LAB — URINALYSIS, ROUTINE W REFLEX MICROSCOPIC
Bilirubin Urine: NEGATIVE
Glucose, UA: NEGATIVE mg/dL
Ketones, ur: NEGATIVE mg/dL
Nitrite: POSITIVE — AB
Protein, ur: 100 mg/dL — AB
Specific Gravity, Urine: 1.015 (ref 1.005–1.030)
WBC, UA: >50 WBC/hpf (ref 0–5)
pH: 6 (ref 5.0–8.0)

## 2022-06-21 LAB — GLUCOSE, CAPILLARY
Glucose-Capillary: 139 mg/dL — ABNORMAL HIGH (ref 70–99)
Glucose-Capillary: 151 mg/dL — ABNORMAL HIGH (ref 70–99)
Glucose-Capillary: 154 mg/dL — ABNORMAL HIGH (ref 70–99)
Glucose-Capillary: 157 mg/dL — ABNORMAL HIGH (ref 70–99)
Glucose-Capillary: 160 mg/dL — ABNORMAL HIGH (ref 70–99)
Glucose-Capillary: 164 mg/dL — ABNORMAL HIGH (ref 70–99)
Glucose-Capillary: 167 mg/dL — ABNORMAL HIGH (ref 70–99)
Glucose-Capillary: 169 mg/dL — ABNORMAL HIGH (ref 70–99)
Glucose-Capillary: 171 mg/dL — ABNORMAL HIGH (ref 70–99)
Glucose-Capillary: 172 mg/dL — ABNORMAL HIGH (ref 70–99)
Glucose-Capillary: 173 mg/dL — ABNORMAL HIGH (ref 70–99)
Glucose-Capillary: 174 mg/dL — ABNORMAL HIGH (ref 70–99)
Glucose-Capillary: 183 mg/dL — ABNORMAL HIGH (ref 70–99)
Glucose-Capillary: 186 mg/dL — ABNORMAL HIGH (ref 70–99)
Glucose-Capillary: 189 mg/dL — ABNORMAL HIGH (ref 70–99)
Glucose-Capillary: 192 mg/dL — ABNORMAL HIGH (ref 70–99)
Glucose-Capillary: 195 mg/dL — ABNORMAL HIGH (ref 70–99)
Glucose-Capillary: 202 mg/dL — ABNORMAL HIGH (ref 70–99)
Glucose-Capillary: 232 mg/dL — ABNORMAL HIGH (ref 70–99)

## 2022-06-21 LAB — BASIC METABOLIC PANEL
Anion gap: 18 — ABNORMAL HIGH (ref 5–15)
Anion gap: 13 (ref 5–15)
Anion gap: 11 (ref 5–15)
Anion gap: 9 (ref 5–15)
BUN: 23 mg/dL — ABNORMAL HIGH (ref 6–20)
BUN: 24 mg/dL — ABNORMAL HIGH (ref 6–20)
BUN: 23 mg/dL — ABNORMAL HIGH (ref 6–20)
BUN: 22 mg/dL — ABNORMAL HIGH (ref 6–20)
CO2: 8 mmol/L — ABNORMAL LOW (ref 22–32)
CO2: 12 mmol/L — ABNORMAL LOW (ref 22–32)
CO2: 16 mmol/L — ABNORMAL LOW (ref 22–32)
CO2: 15 mmol/L — ABNORMAL LOW (ref 22–32)
Calcium: 8.4 mg/dL — ABNORMAL LOW (ref 8.9–10.3)
Calcium: 8.5 mg/dL — ABNORMAL LOW (ref 8.9–10.3)
Calcium: 8.4 mg/dL — ABNORMAL LOW (ref 8.9–10.3)
Calcium: 8.1 mg/dL — ABNORMAL LOW (ref 8.9–10.3)
Chloride: 108 mmol/L (ref 98–111)
Chloride: 110 mmol/L (ref 98–111)
Chloride: 112 mmol/L — ABNORMAL HIGH (ref 98–111)
Chloride: 114 mmol/L — ABNORMAL HIGH (ref 98–111)
Creatinine, Ser: 0.99 mg/dL (ref 0.44–1.00)
Creatinine, Ser: 0.81 mg/dL (ref 0.44–1.00)
Creatinine, Ser: 0.78 mg/dL (ref 0.44–1.00)
Creatinine, Ser: 0.73 mg/dL (ref 0.44–1.00)
GFR, Estimated: >60 mL/min (ref >60)
GFR, Estimated: >60 mL/min (ref >60)
GFR, Estimated: >60 mL/min (ref >60)
GFR, Estimated: >60 mL/min (ref >60)
Glucose, Bld: 178 mg/dL — ABNORMAL HIGH (ref 70–99)
Glucose, Bld: 192 mg/dL — ABNORMAL HIGH (ref 70–99)
Glucose, Bld: 182 mg/dL — ABNORMAL HIGH (ref 70–99)
Glucose, Bld: 220 mg/dL — ABNORMAL HIGH (ref 70–99)
Potassium: 4.1 mmol/L (ref 3.5–5.1)
Potassium: 3.7 mmol/L (ref 3.5–5.1)
Potassium: 3.4 mmol/L — ABNORMAL LOW (ref 3.5–5.1)
Potassium: 3.4 mmol/L — ABNORMAL LOW (ref 3.5–5.1)
Sodium: 134 mmol/L — ABNORMAL LOW (ref 135–145)
Sodium: 135 mmol/L (ref 135–145)
Sodium: 139 mmol/L (ref 135–145)
Sodium: 138 mmol/L (ref 135–145)

## 2022-06-21 LAB — BETA-HYDROXYBUTYRIC ACID
Beta-Hydroxybutyric Acid: 7.61 mmol/L — ABNORMAL HIGH (ref 0.05–0.27)
Beta-Hydroxybutyric Acid: 2.48 mmol/L — ABNORMAL HIGH (ref 0.05–0.27)

## 2022-06-21 MED ORDER — INSULIN STARTER KIT- PEN NEEDLES (ENGLISH)
1.0000 | Freq: Once | Status: AC
  Administered 2022-06-21: 1
  Filled 2022-06-21: qty 1

## 2022-06-21 MED ORDER — SODIUM CHLORIDE 0.9 % IV BOLUS
2000.0000 mL | Freq: Once | INTRAVENOUS | Status: AC
  Administered 2022-06-21: 2000 mL via INTRAVENOUS

## 2022-06-21 MED ORDER — POTASSIUM CHLORIDE CRYS ER 20 MEQ PO TBCR
40.0000 meq | EXTENDED_RELEASE_TABLET | Freq: Two times a day (BID) | ORAL | Status: AC
  Administered 2022-06-21 (×2): 40 meq via ORAL
  Filled 2022-06-21 (×2): qty 2

## 2022-06-21 MED ORDER — ORAL CARE MOUTH RINSE: 15.0000 mL | OROMUCOSAL | Status: DC | PRN

## 2022-06-21 MED ORDER — LIVING WELL WITH DIABETES BOOK
Freq: Once | Status: AC
  Filled 2022-06-21: qty 1

## 2022-06-21 MED ORDER — PHENAZOPYRIDINE HCL 100 MG PO TABS
100.0000 mg | ORAL_TABLET | Freq: Three times a day (TID) | ORAL | Status: DC
  Administered 2022-06-21 – 2022-06-24 (×8): 100 mg via ORAL
  Filled 2022-06-21 (×9): qty 1

## 2022-06-21 NOTE — TOC Initial Note (Signed)
Transition of Care [facility]) - Initial/Assessment Note    Patient Details  Name: Amanda Kent MRN: [id number] Date of Birth: Sep 15, 1995  [facility]) CM/SW Contact:    Lavenia Atlas, RN Phone Number: 06/21/2022, 6:08 PM  Clinical Narrative:                 Per chart review [facility], has no insurance or PCP.   TOC will continue to follow for dc needs         Patient Goals and CMS Choice            Expected Discharge Plan and Services                                              Prior Living Arrangements/Services                       Activities of Daily Living Home Assistive Devices/Equipment: None ADL Screening (condition at time of admission) Patient's cognitive ability adequate to safely complete daily activities?: Yes Is the patient deaf or have difficulty hearing?: No Does the patient have difficulty seeing, even when wearing glasses/contacts?: No Does the patient have difficulty concentrating, remembering, or making decisions?: No Patient able to express need for assistance with ADLs?: No Does the patient have difficulty dressing or bathing?: No Independently performs ADLs?: Yes (appropriate for developmental age) Does the patient have difficulty walking or climbing stairs?: No Weakness of Legs: None Weakness of Arms/Hands: None  Permission Sought/Granted                  Emotional Assessment              Admission diagnosis:  DKA (diabetic ketoacidosis) (HCC) [E11.10] Diabetic ketoacidosis without coma associated with other specified diabetes mellitus (HCC) [E13.10] Patient Active Problem List   Diagnosis Date Noted   DKA (diabetic ketoacidosis) (HCC) 06/20/2022   DKA (diabetic ketoacidoses) 01/26/2018   Nausea & vomiting 01/26/2018   DKA, type 2 (HCC) 01/26/2018   LGSIL on Pap smear of cervix 03/26/2017   Type 2 diabetes mellitus without complication, without long-term current use  of insulin (HCC) 02/07/2015   BMI 32.0-32.9,adult 02/07/2015   PCP:  Patient, No Pcp Per Pharmacy:   [id number] - [place], [place] - [address] AT [facility] [address] [place] [address] Phone: [phone] Fax: [phone]  [facility] [place] - [id number] [phone] [place] [address] Phone: [phone] Fax: [phone]  [facility] - [place], [place] - [address] [place] [address] Phone: [phone] Fax: [phone]     Social Determinants of Health (SDOH) Social History: SDOH Screenings   Food Insecurity: No Food Insecurity (06/21/2022)  Housing: Low Risk  (06/21/2022)  Transportation Needs: No Transportation Needs (06/21/2022)  Utilities: Not At Risk (06/21/2022)  Depression (PHQ2-9): Low Risk  (06/19/2022)  Tobacco Use: Low Risk  (06/20/2022)   SDOH Interventions:     Readmission Risk Interventions     No data to display

## 2022-06-21 NOTE — Inpatient Diabetes Management (Signed)
Inpatient Diabetes Program Recommendations  AACE/ADA: New Consensus Statement on Inpatient Glycemic Control (2015)  Target Ranges:  Prepandial:   less than 140 mg/dL      Peak postprandial:   less than 180 mg/dL (1-2 hours)      Critically ill patients:  140 - 180 mg/dL   Lab Results  Component Value Date   GLUCAP 195 (H) 06/21/2022   HGBA1C 11.0 (H) 04/23/2018    Review of Glycemic Control  Diabetes history: DM2 Outpatient Diabetes medications: metformin 1000 mg BID Current orders for Inpatient glycemic control: IV insulin per [employer] for DKA  HgbA1C - 13.8%  Inpatient Diabetes Program Recommendations:    When criteria met for transition, give basal insulin 1-2 Hours prior to discontinuation of drip.  Semglee 25 units Q24H Novolog 0-15 TID with meals and 0-5 HS Novolog 4 units TID with meals if eating > 50%  For discharge: pt has no insurance and will need affordable insulin.  Novolin ReliOn 70/30 18 units BID Novolin R (for moderate s/s) TID  Spoke with pt and family both this morning and this afternoon regarding her uncontrolled DM. Pt said she is only on metformin at home, not taking Semglee, Trulicity and 70/30 insulin, which she has been on in the past, according to EMR. Pt not checking blood sugars. When asked questions, pt looks to mother for answers. Not forthcoming with information. Stressed importance of controlling blood sugars to reduce risk of both long and short-term complications. Spoke about 70/30 insulin at [facility] and pt's M said she will help pt with the cost of $44 for 5 pens. Pt had previously given insulin in her arm, recommended abdomen for better absorption. Stressed importance of checking blood sugars and taking logbook to PCP for review. Discussed Discussed A1C results (13.8%) and explained that current A1C indicates an average glucose of 349 mg/dl over the past 2-3 months. Discussed glucose and A1C goals. Discussed importance of checking CBGs and  maintaining good CBG control to prevent long-term and short-term complications. Explained how hyperglycemia leads to damage within blood vessels which lead to the common complications seen with uncontrolled diabetes. Stressed to the patient the importance of improving glycemic control to prevent further complications from uncontrolled diabetes. Discussed impact of nutrition, exercise, stress, sickness, and medications on diabetes control. Reviewed hypoglycemia s/s and treatment. Pt voices understanding. Answered all questions.  Discussed above with RN.  Follow.  Thank you. Ailene Ards, RD, LDN, CDCES Inpatient Diabetes Coordinator [phone]

## 2022-06-21 NOTE — Progress Notes (Signed)
[facility] PROGRESS NOTE    Progress Note  [name]  ZOX:[id number] DOB: 1995/06/17 DOA: 06/20/2022 PCP: Patient, No Pcp Per     Brief Narrative:   Amanda Kent is an 27 y.o. female past medical history of non-insulin-dependent diabetes mellitus who comes in in DKA found to have a BVG less than 7 and in DKA.  She has been noncompliant with her insulin due to financial reasons and was prescribed more than a month ago she had not been taking it.   Assessment/Plan:   DKA (diabetic ketoacidosis) (HCC) Started on IV insulin and IV fluids.  She is only positive about 2 L urine seems significantly concentrated.  UDS negative. Pregnancy test negative, she relates no new medications.   Continue IV insulin, will give her 2 L bolus of normal saline,, continue D5.  Do not stop insulin if blood glucose goes below 100 we can switch her to D10. Monitor strict I's and O's and daily weights. Keep her n.p.o. until she is more awake sips with meds.  Acute kidney injury: Likely due to DKA started on IV fluids creatinine is improving slowly. Expect hemoglobin to drop after fluid resuscitation.  Leukocytosis: Likely reactive, she has remained afebrile. Chest x-ray showed no acute cardiopulmonary abnormalities. No cords or sores on her skin.  Normocytic anemia: Expect hemoglobin to drop after fluid resuscitation.  Bacterial vaginosis: Continue Flagyl and fluconazole  Hypovolemic hyponatremia: Continue IV fluids.  Sinus tachycardia: Likely due to DKA continue to monitor.  DVT prophylaxis: lovenox Family Communication: Mother Status is: Inpatient Remains inpatient appropriate because: DKA    Code Status:     Code Status Orders  (From admission, onward)           Start     Ordered   06/20/22 1905  Full code  Continuous       Question:  By:  Answer:  Other   06/20/22 1910           Code Status History     Date Active Date Inactive Code Status Order  ID Comments User Context   01/26/2018 0211 01/27/2018 1343 Full Code [id number]  Eduard Clos, MD ED         IV Access:   Peripheral IV   Procedures and diagnostic studies:   DG Chest Port 1 View  Result Date: 06/20/2022 CLINICAL DATA:  Shortness of breath EXAM: PORTABLE CHEST 1 VIEW COMPARISON:  X-ray 01/26/2018 FINDINGS: Underinflation. No consolidation, pneumothorax or effusion. No edema. Normal cardiopericardial silhouette. Overlapping cardiac leads. IMPRESSION: No acute cardiopulmonary disease. Electronically Signed   By: Karen Kays M.D.   On: 06/20/2022 18:57     Medical Consultants:   None.   Subjective:    Amanda Kent she relates she feels slightly better than when she came in.  Objective:    Vitals:   06/21/22 0300 06/21/22 0400 06/21/22 0410 06/21/22 0500  BP: 127/85 131/76  123/81  Pulse: (!) 127 (!) 118  (!) 125  Resp: (!) 26 (!) 24  (!) 26  Temp:   98.4 F (36.9 C)   TempSrc:   Oral   SpO2: 98% 97%  96%  Weight:      Height:       SpO2: 96 %   Intake/Output Summary (Last 24 hours) at 06/21/2022 0643 Last data filed at 06/21/2022 0312 Gross per 24 hour  Intake 2893.7 ml  Output --  Net 2893.7 ml   Filed Weights   06/20/22  2120  Weight: 84.7 kg    Exam: General exam: In no acute distress. Respiratory system: Good air movement and clear to auscultation. Cardiovascular system: S1 & S2 heard, RRR. No JVD. Gastrointestinal system: Abdomen is nondistended, soft and nontender.  Extremities: No pedal edema. Skin: No rashes, lesions or ulcers Psychiatry: No judgment or insight of medical condition.   Data Reviewed:    Labs: Basic Metabolic Panel: Recent Labs  Lab 06/20/22 1735 06/20/22 2246 06/21/22 0147  NA 131* 137 134*  K 4.5 4.2 4.1  CL 104 111 108  CO2 <7* <7* 8*  GLUCOSE 447* 147* 178*  BUN 26* 24* 23*  CREATININE 1.12* 1.08* 0.99  CALCIUM 8.6* 8.7* 8.4*  MG 2.4  --   --    GFR Estimated Creatinine  Clearance: 92.8 mL/min (by C-G formula based on SCr of 0.99 mg/dL). Liver Function Tests: Recent Labs  Lab 06/20/22 1735  AST 11*  ALT 14  ALKPHOS 82  BILITOT 1.4*  PROT 9.1*  ALBUMIN 3.8   Recent Labs  Lab 06/20/22 1735  LIPASE 57*   No results for input(s): "AMMONIA" in the last 168 hours. Coagulation profile No results for input(s): "INR", "PROTIME" in the last 168 hours. COVID-19 Labs  No results for input(s): "DDIMER", "FERRITIN", "LDH", "CRP" in the last 72 hours.  No results found for: "SARSCOV2NAA"  CBC: Recent Labs  Lab 06/20/22 1735 06/21/22 0147  WBC 20.7* 21.9*  HGB 14.7 14.6  HCT 48.9* 47.6*  MCV 93.7 92.4  PLT 306 265   Cardiac Enzymes: No results for input(s): "CKTOTAL", "CKMB", "CKMBINDEX", "TROPONINI" in the last 168 hours. BNP (last 3 results) No results for input(s): "PROBNP" in the last 8760 hours. CBG: Recent Labs  Lab 06/21/22 0115 06/21/22 0209 06/21/22 0314 06/21/22 0426 06/21/22 0541  GLUCAP 171* 154* 192* 183* 186*   D-Dimer: No results for input(s): "DDIMER" in the last 72 hours. Hgb A1c: No results for input(s): "HGBA1C" in the last 72 hours. Lipid Profile: No results for input(s): "CHOL", "HDL", "LDLCALC", "TRIG", "CHOLHDL", "LDLDIRECT" in the last 72 hours. Thyroid function studies: No results for input(s): "TSH", "T4TOTAL", "T3FREE", "THYROIDAB" in the last 72 hours.  Invalid input(s): "FREET3" Anemia work up: No results for input(s): "VITAMINB12", "FOLATE", "FERRITIN", "TIBC", "IRON", "RETICCTPCT" in the last 72 hours. Sepsis Labs: Recent Labs  Lab 06/20/22 1735 06/21/22 0147  WBC 20.7* 21.9*   Microbiology Recent Results (from the past 240 hour(s))  MRSA Next Gen by PCR, Nasal     Status: None   Collection Time: 06/20/22  9:24 PM   Specimen: Nasal Mucosa; Nasal Swab  Result Value Ref Range Status   MRSA by PCR Next Gen NOT DETECTED NOT DETECTED Final    Comment: (NOTE) The GeneXpert MRSA Assay (FDA approved  for NASAL specimens only), is one component of a comprehensive MRSA colonization surveillance program. It is not intended to diagnose MRSA infection nor to guide or monitor treatment for MRSA infections. Test performance is not FDA approved in patients less than 87 years old. Performed at [facility], [address], [place], [place] [address]      Medications:    Chlorhexidine Gluconate Cloth  6 each Topical Daily   fluconazole  150 mg Oral Once   heparin  5,000 Units Subcutaneous Q8H   metroNIDAZOLE  500 mg Oral Q12H   Continuous Infusions:  sodium chloride 10 mL/hr at 06/21/22 0312   dextrose 5% lactated ringers 125 mL/hr at 06/21/22 0531  insulin 2.2 Units/hr (06/21/22 1610)   lactated ringers Stopped (06/20/22 2213)   sodium chloride        LOS: 1 day   Marinda Elk  [facility]  06/21/2022, 6:43 AM

## 2022-06-22 LAB — GLUCOSE, CAPILLARY
Glucose-Capillary: 124 mg/dL — ABNORMAL HIGH (ref 70–99)
Glucose-Capillary: 129 mg/dL — ABNORMAL HIGH (ref 70–99)
Glucose-Capillary: 131 mg/dL — ABNORMAL HIGH (ref 70–99)
Glucose-Capillary: 135 mg/dL — ABNORMAL HIGH (ref 70–99)
Glucose-Capillary: 137 mg/dL — ABNORMAL HIGH (ref 70–99)
Glucose-Capillary: 139 mg/dL — ABNORMAL HIGH (ref 70–99)
Glucose-Capillary: 151 mg/dL — ABNORMAL HIGH (ref 70–99)
Glucose-Capillary: 162 mg/dL — ABNORMAL HIGH (ref 70–99)
Glucose-Capillary: 162 mg/dL — ABNORMAL HIGH (ref 70–99)
Glucose-Capillary: 176 mg/dL — ABNORMAL HIGH (ref 70–99)
Glucose-Capillary: 182 mg/dL — ABNORMAL HIGH (ref 70–99)
Glucose-Capillary: 185 mg/dL — ABNORMAL HIGH (ref 70–99)
Glucose-Capillary: 186 mg/dL — ABNORMAL HIGH (ref 70–99)
Glucose-Capillary: 188 mg/dL — ABNORMAL HIGH (ref 70–99)
Glucose-Capillary: 193 mg/dL — ABNORMAL HIGH (ref 70–99)
Glucose-Capillary: 198 mg/dL — ABNORMAL HIGH (ref 70–99)
Glucose-Capillary: 201 mg/dL — ABNORMAL HIGH (ref 70–99)
Glucose-Capillary: 207 mg/dL — ABNORMAL HIGH (ref 70–99)
Glucose-Capillary: 217 mg/dL — ABNORMAL HIGH (ref 70–99)
Glucose-Capillary: 223 mg/dL — ABNORMAL HIGH (ref 70–99)
Glucose-Capillary: 240 mg/dL — ABNORMAL HIGH (ref 70–99)

## 2022-06-22 LAB — BASIC METABOLIC PANEL
Anion gap: 6 (ref 5–15)
Anion gap: 9 (ref 5–15)
Anion gap: 7 (ref 5–15)
BUN: 20 mg/dL (ref 6–20)
BUN: 18 mg/dL (ref 6–20)
BUN: 16 mg/dL (ref 6–20)
CO2: 16 mmol/L — ABNORMAL LOW (ref 22–32)
CO2: 15 mmol/L — ABNORMAL LOW (ref 22–32)
CO2: 18 mmol/L — ABNORMAL LOW (ref 22–32)
Calcium: 7.9 mg/dL — ABNORMAL LOW (ref 8.9–10.3)
Calcium: 8.1 mg/dL — ABNORMAL LOW (ref 8.9–10.3)
Calcium: 7.9 mg/dL — ABNORMAL LOW (ref 8.9–10.3)
Chloride: 116 mmol/L — ABNORMAL HIGH (ref 98–111)
Chloride: 114 mmol/L — ABNORMAL HIGH (ref 98–111)
Chloride: 110 mmol/L (ref 98–111)
Creatinine, Ser: 0.7 mg/dL (ref 0.44–1.00)
Creatinine, Ser: 0.68 mg/dL (ref 0.44–1.00)
Creatinine, Ser: 0.65 mg/dL (ref 0.44–1.00)
GFR, Estimated: >60 mL/min (ref >60)
GFR, Estimated: >60 mL/min (ref >60)
GFR, Estimated: >60 mL/min (ref >60)
Glucose, Bld: 158 mg/dL — ABNORMAL HIGH (ref 70–99)
Glucose, Bld: 228 mg/dL — ABNORMAL HIGH (ref 70–99)
Glucose, Bld: 178 mg/dL — ABNORMAL HIGH (ref 70–99)
Potassium: 3.1 mmol/L — ABNORMAL LOW (ref 3.5–5.1)
Potassium: 2.8 mmol/L — ABNORMAL LOW (ref 3.5–5.1)
Potassium: 2.9 mmol/L — ABNORMAL LOW (ref 3.5–5.1)
Sodium: 138 mmol/L (ref 135–145)
Sodium: 138 mmol/L (ref 135–145)
Sodium: 135 mmol/L (ref 135–145)

## 2022-06-22 LAB — URINE CULTURE: Culture: 30000 — AB

## 2022-06-22 LAB — BETA-HYDROXYBUTYRIC ACID: Beta-Hydroxybutyric Acid: 1 mmol/L — ABNORMAL HIGH (ref 0.05–0.27)

## 2022-06-22 LAB — CBC
HCT: 34.6 % — ABNORMAL LOW (ref 36.0–46.0)
Hemoglobin: 11.6 g/dL — ABNORMAL LOW (ref 12.0–15.0)
MCH: 28.5 pg (ref 26.0–34.0)
MCHC: 33.5 g/dL (ref 30.0–36.0)
MCV: 85 fL (ref 80.0–100.0)
Platelets: 226 10*3/uL (ref 150–400)
RBC: 4.07 MIL/uL (ref 3.87–5.11)
RDW: 13 % (ref 11.5–15.5)
WBC: 10.8 10*3/uL — ABNORMAL HIGH (ref 4.0–10.5)
nRBC: 0 % (ref 0.0–0.2)

## 2022-06-22 LAB — HEMOGLOBIN A1C
Hgb A1c MFr Bld: 14.2 % — ABNORMAL HIGH (ref 4.8–5.6)
Mean Plasma Glucose: 360.84 mg/dL

## 2022-06-22 MED ORDER — POTASSIUM CHLORIDE CRYS ER 20 MEQ PO TBCR
40.0000 meq | EXTENDED_RELEASE_TABLET | Freq: Two times a day (BID) | ORAL | Status: DC
  Administered 2022-06-22: 40 meq via ORAL
  Filled 2022-06-22: qty 4

## 2022-06-22 MED ORDER — POTASSIUM CHLORIDE 10 MEQ/100ML IV SOLN
10.0000 meq | INTRAVENOUS | Status: AC
  Administered 2022-06-22 – 2022-06-23 (×4): 10 meq via INTRAVENOUS
  Filled 2022-06-22 (×4): qty 100

## 2022-06-22 MED ORDER — DEXTROSE-SODIUM CHLORIDE 5-0.45 % IV SOLN: INTRAVENOUS | Status: DC

## 2022-06-22 MED ORDER — POTASSIUM CHLORIDE 20 MEQ PO PACK
40.0000 meq | PACK | Freq: Three times a day (TID) | ORAL | Status: DC
  Administered 2022-06-22 (×2): 40 meq via ORAL
  Filled 2022-06-22 (×2): qty 2

## 2022-06-22 MED ORDER — POTASSIUM CHLORIDE CRYS ER 20 MEQ PO TBCR
40.0000 meq | EXTENDED_RELEASE_TABLET | Freq: Three times a day (TID) | ORAL | Status: DC
  Filled 2022-06-22: qty 2

## 2022-06-22 MED ORDER — SODIUM CHLORIDE 0.9 % IV SOLN
2.0000 g | INTRAVENOUS | Status: DC
  Administered 2022-06-22: 2 g via INTRAVENOUS
  Filled 2022-06-22: qty 20

## 2022-06-22 NOTE — Plan of Care (Signed)

## 2022-06-22 NOTE — Progress Notes (Signed)
[facility] PROGRESS NOTE    Progress Note  Amanda Kent  ZOX:[id number] DOB: 1995-09-18 DOA: 06/20/2022 PCP: Patient, No Pcp Per     Brief Narrative:   Amanda Kent is an 27 y.o. female past medical history of non-insulin-dependent diabetes mellitus who comes in in DKA found to have a BVG less than 7 and in DKA.  She has been noncompliant with her insulin due to financial reasons and was prescribed more than a month ago she had not been taking it.   Assessment/Plan:   DKA (diabetic ketoacidosis) (HCC) Continue IV insulin, and D5.  Bicarb is still low. Continue strict I's and O's and daily weights. Continue IV insulin until bicarb greater than 20. Allow a diet.  Acute kidney injury: Likely due to DKA, resolved with fluid resuscitation.  Leukocytosis possibly due to UTI: Improved has remained afebrile. Urine cultures are pending, started on IV Rocephin.  Normocytic anemia: 11.6 follow-up with the PCP as an outpatient.  Bacterial vaginosis: Continue Flagyl and fluconazole  Hypovolemic hyponatremia: Resolved with fluid cessation.  Sinus tachycardia: Likely due to DKA continue to monitor.  DVT prophylaxis: lovenox Family Communication: Mother Status is: Inpatient Remains inpatient appropriate because: DKA    Code Status:     Code Status Orders  (From admission, onward)           Start     Ordered   06/20/22 1905  Full code  Continuous       Question:  By:  Answer:  Other   06/20/22 1910           Code Status History     Date Active Date Inactive Code Status Order ID Comments User Context   01/26/2018 0211 01/27/2018 1343 Full Code [id number]  Eduard Clos, MD ED         IV Access:   Peripheral IV   Procedures and diagnostic studies:   US PELVIS (TRANSABDOMINAL ONLY)  Result Date: 06/21/2022 CLINICAL DATA:  [id number] Pelvic pain [id number] EXAM: TRANSABDOMINAL ULTRASOUND OF PELVIS TECHNIQUE: Transabdominal ultrasound  examination of the pelvis was performed including evaluation of the uterus, ovaries, adnexal regions, and pelvic cul-de-sac. Patient declined transvaginal imaging. COMPARISON:  None Available. FINDINGS: Uterus Measurements: 6.9 x 2.5 x 4.4 cm = volume: 39 mL. No fibroids or other mass visualized. Endometrium Thickness: 4 mm.  No focal abnormality visualized. Right ovary Not visualized.  Obscured by shadowing bowel gas. Left ovary Measurements: 2.4 x 2.5 x 2.1 cm = volume: 5 mL. Normal appearance/no adnexal mass. Other findings:  No abnormal free fluid. IMPRESSION: 1. Unremarkable sonographic appearance of the uterus and left ovary. 2. Nonvisualization of the right ovary. Electronically Signed   By: Duanne Guess D.O.   On: 06/21/2022 09:13   DG Chest Port 1 View  Result Date: 06/20/2022 CLINICAL DATA:  Shortness of breath EXAM: PORTABLE CHEST 1 VIEW COMPARISON:  X-ray 01/26/2018 FINDINGS: Underinflation. No consolidation, pneumothorax or effusion. No edema. Normal cardiopericardial silhouette. Overlapping cardiac leads. IMPRESSION: No acute cardiopulmonary disease. Electronically Signed   By: Karen Kays M.D.   On: 06/20/2022 18:57     Medical Consultants:   None.   Subjective:    [employer] like she feels better tolerating her diet.  Objective:    Vitals:   06/22/22 0004 06/22/22 0400 06/22/22 0500 06/22/22 0600  BP:  116/78 130/78 110/71  Pulse:  (!) 108 (!) 114 (!) 114  Resp:  (!) 31 (!) 31 (!) 27  Temp: 99  F (37.2 C) 98.3 F (36.8 C)    TempSrc: Oral Oral    SpO2:  95% 98% 94%  Weight:      Height:       SpO2: 94 %   Intake/Output Summary (Last 24 hours) at 06/22/2022 0705 Last data filed at 06/22/2022 4098 Gross per 24 hour  Intake 3960.82 ml  Output 1100 ml  Net 2860.82 ml    Filed Weights   06/20/22 2120  Weight: 84.7 kg    Exam: General exam: In no acute distress. Respiratory system: Good air movement and clear to auscultation. Cardiovascular  system: S1 & S2 heard, RRR. No JVD. Gastrointestinal system: Abdomen is nondistended, soft and nontender.  Extremities: No pedal edema. Skin: No rashes, lesions or ulcers Psychiatry: Judgement and insight appear normal. Mood & affect appropriate. Data Reviewed:    Labs: Basic Metabolic Panel: Recent Labs  Lab 06/20/22 1735 06/20/22 2246 06/21/22 0147 06/21/22 0621 06/21/22 1112 06/21/22 1449 06/22/22 0409  NA 131*   < > 134* 135 139 138 138  K 4.5   < > 4.1 3.7 3.4* 3.4* 3.1*  CL 104   < > 108 110 112* 114* 116*  CO2 <7*   < > 8* 12* 16* 15* 16*  GLUCOSE 447*   < > 178* 192* 182* 220* 158*  BUN 26*   < > 23* 24* 23* 22* 20  CREATININE 1.12*   < > 0.99 0.81 0.78 0.73 0.70  CALCIUM 8.6*   < > 8.4* 8.5* 8.4* 8.1* 7.9*  MG 2.4  --   --   --   --   --   --    < > = values in this interval not displayed.    GFR Estimated Creatinine Clearance: 114.9 mL/min (by C-G formula based on SCr of 0.7 mg/dL). Liver Function Tests: Recent Labs  Lab 06/20/22 1735  AST 11*  ALT 14  ALKPHOS 82  BILITOT 1.4*  PROT 9.1*  ALBUMIN 3.8    Recent Labs  Lab 06/20/22 1735  LIPASE 57*    No results for input(s): "AMMONIA" in the last 168 hours. Coagulation profile No results for input(s): "INR", "PROTIME" in the last 168 hours. COVID-19 Labs  No results for input(s): "DDIMER", "FERRITIN", "LDH", "CRP" in the last 72 hours.  No results found for: "SARSCOV2NAA"  CBC: Recent Labs  Lab 06/20/22 1735 06/21/22 0147 06/22/22 0258  WBC 20.7* 21.9* 10.8*  HGB 14.7 14.6 11.6*  HCT 48.9* 47.6* 34.6*  MCV 93.7 92.4 85.0  PLT 306 265 226    Cardiac Enzymes: No results for input(s): "CKTOTAL", "CKMB", "CKMBINDEX", "TROPONINI" in the last 168 hours. BNP (last 3 results) No results for input(s): "PROBNP" in the last 8760 hours. CBG: Recent Labs  Lab 06/22/22 0208 06/22/22 0304 06/22/22 0408 06/22/22 0508 06/22/22 0612  GLUCAP 137* 129* 135* 207* 139*    D-Dimer: No results  for input(s): "DDIMER" in the last 72 hours. Hgb A1c: Recent Labs    06/21/22 1917  HGBA1C 14.2*   Lipid Profile: No results for input(s): "CHOL", "HDL", "LDLCALC", "TRIG", "CHOLHDL", "LDLDIRECT" in the last 72 hours. Thyroid function studies: No results for input(s): "TSH", "T4TOTAL", "T3FREE", "THYROIDAB" in the last 72 hours.  Invalid input(s): "FREET3" Anemia work up: No results for input(s): "VITAMINB12", "FOLATE", "FERRITIN", "TIBC", "IRON", "RETICCTPCT" in the last 72 hours. Sepsis Labs: Recent Labs  Lab 06/20/22 1735 06/21/22 0147 06/22/22 0258  WBC 20.7* 21.9* 10.8*    Microbiology Recent Results (from  the past 240 hour(s))  MRSA Next Gen by PCR, Nasal     Status: None   Collection Time: 06/20/22  9:24 PM   Specimen: Nasal Mucosa; Nasal Swab  Result Value Ref Range Status   MRSA by PCR Next Gen NOT DETECTED NOT DETECTED Final    Comment: (NOTE) The GeneXpert MRSA Assay (FDA approved for NASAL specimens only), is one component of a comprehensive MRSA colonization surveillance program. It is not intended to diagnose MRSA infection nor to guide or monitor treatment for MRSA infections. Test performance is not FDA approved in patients less than 25 years old. Performed at [facility], [address], [place], [place] [address]      Medications:    Chlorhexidine Gluconate Cloth  6 each Topical Daily   heparin  5,000 Units Subcutaneous Q8H   metroNIDAZOLE  500 mg Oral Q12H   phenazopyridine  100 mg Oral TID WC   potassium chloride  40 mEq Oral BID   Continuous Infusions:  sodium chloride Stopped (06/21/22 0652)   cefTRIAXone (ROCEPHIN)  IV     dextrose 5% lactated ringers 125 mL/hr at 06/22/22 0639   insulin 1 Units/hr (06/22/22 0639)   lactated ringers Stopped (06/20/22 2213)      LOS: 2 days   Marinda Elk  [facility]  06/22/2022, 7:05 AM

## 2022-06-23 LAB — CBC
HCT: 34.8 % — ABNORMAL LOW (ref 36.0–46.0)
Hemoglobin: 11.6 g/dL — ABNORMAL LOW (ref 12.0–15.0)
MCH: 28.2 pg (ref 26.0–34.0)
MCHC: 33.3 g/dL (ref 30.0–36.0)
MCV: 84.5 fL (ref 80.0–100.0)
Platelets: 235 10*3/uL (ref 150–400)
RBC: 4.12 MIL/uL (ref 3.87–5.11)
RDW: 13.4 % (ref 11.5–15.5)
WBC: 7.9 10*3/uL (ref 4.0–10.5)
nRBC: 0 % (ref 0.0–0.2)

## 2022-06-23 LAB — BASIC METABOLIC PANEL
Anion gap: 9 (ref 5–15)
Anion gap: 7 (ref 5–15)
BUN: 14 mg/dL (ref 6–20)
BUN: 11 mg/dL (ref 6–20)
CO2: 17 mmol/L — ABNORMAL LOW (ref 22–32)
CO2: 20 mmol/L — ABNORMAL LOW (ref 22–32)
Calcium: 7.9 mg/dL — ABNORMAL LOW (ref 8.9–10.3)
Calcium: 7.7 mg/dL — ABNORMAL LOW (ref 8.9–10.3)
Chloride: 113 mmol/L — ABNORMAL HIGH (ref 98–111)
Chloride: 111 mmol/L (ref 98–111)
Creatinine, Ser: 0.62 mg/dL (ref 0.44–1.00)
Creatinine, Ser: 0.57 mg/dL (ref 0.44–1.00)
GFR, Estimated: >60 mL/min (ref >60)
GFR, Estimated: >60 mL/min (ref >60)
Glucose, Bld: 177 mg/dL — ABNORMAL HIGH (ref 70–99)
Glucose, Bld: 157 mg/dL — ABNORMAL HIGH (ref 70–99)
Potassium: 3.7 mmol/L (ref 3.5–5.1)
Potassium: 3 mmol/L — ABNORMAL LOW (ref 3.5–5.1)
Sodium: 139 mmol/L (ref 135–145)
Sodium: 138 mmol/L (ref 135–145)

## 2022-06-23 LAB — GLUCOSE, CAPILLARY
Glucose-Capillary: 134 mg/dL — ABNORMAL HIGH (ref 70–99)
Glucose-Capillary: 141 mg/dL — ABNORMAL HIGH (ref 70–99)
Glucose-Capillary: 141 mg/dL — ABNORMAL HIGH (ref 70–99)
Glucose-Capillary: 141 mg/dL — ABNORMAL HIGH (ref 70–99)
Glucose-Capillary: 144 mg/dL — ABNORMAL HIGH (ref 70–99)
Glucose-Capillary: 146 mg/dL — ABNORMAL HIGH (ref 70–99)
Glucose-Capillary: 197 mg/dL — ABNORMAL HIGH (ref 70–99)
Glucose-Capillary: 220 mg/dL — ABNORMAL HIGH (ref 70–99)
Glucose-Capillary: 233 mg/dL — ABNORMAL HIGH (ref 70–99)
Glucose-Capillary: 248 mg/dL — ABNORMAL HIGH (ref 70–99)
Glucose-Capillary: 250 mg/dL — ABNORMAL HIGH (ref 70–99)
Glucose-Capillary: 274 mg/dL — ABNORMAL HIGH (ref 70–99)

## 2022-06-23 LAB — BETA-HYDROXYBUTYRIC ACID: Beta-Hydroxybutyric Acid: 0.59 mmol/L — ABNORMAL HIGH (ref 0.05–0.27)

## 2022-06-23 MED ORDER — POTASSIUM CHLORIDE 20 MEQ PO PACK
40.0000 meq | PACK | Freq: Three times a day (TID) | ORAL | Status: DC
  Filled 2022-06-23: qty 2

## 2022-06-23 MED ORDER — INSULIN ASPART PROT & ASPART (70-30 MIX) 100 UNIT/ML ~~LOC~~ SUSP
20.0000 [IU] | Freq: Every day | SUBCUTANEOUS | Status: DC
  Filled 2022-06-23: qty 10

## 2022-06-23 MED ORDER — INSULIN ASPART PROT & ASPART (70-30 MIX) 100 UNIT/ML ~~LOC~~ SUSP
40.0000 [IU] | Freq: Every day | SUBCUTANEOUS | Status: DC
  Administered 2022-06-24: 40 [IU] via SUBCUTANEOUS

## 2022-06-23 MED ORDER — INSULIN ASPART PROT & ASPART (70-30 MIX) 100 UNIT/ML ~~LOC~~ SUSP
30.0000 [IU] | Freq: Every day | SUBCUTANEOUS | Status: DC
  Administered 2022-06-23: 30 [IU] via SUBCUTANEOUS
  Filled 2022-06-23: qty 10

## 2022-06-23 MED ORDER — POTASSIUM CHLORIDE CRYS ER 20 MEQ PO TBCR
40.0000 meq | EXTENDED_RELEASE_TABLET | Freq: Three times a day (TID) | ORAL | Status: AC
  Administered 2022-06-23 (×3): 40 meq via ORAL
  Filled 2022-06-23 (×3): qty 2

## 2022-06-23 MED ORDER — INSULIN ASPART PROT & ASPART (70-30 MIX) 100 UNIT/ML ~~LOC~~ SUSP: 10.0000 [IU] | Freq: Every day | SUBCUTANEOUS | Status: DC

## 2022-06-23 MED ORDER — SODIUM CHLORIDE 0.9 % IV SOLN
2.0000 g | INTRAVENOUS | Status: AC
  Administered 2022-06-23: 2 g via INTRAVENOUS
  Filled 2022-06-23: qty 20

## 2022-06-23 MED ORDER — INSULIN ASPART PROT & ASPART (70-30 MIX) 100 UNIT/ML ~~LOC~~ SUSP
15.0000 [IU] | Freq: Every day | SUBCUTANEOUS | Status: DC
  Administered 2022-06-23: 15 [IU] via SUBCUTANEOUS

## 2022-06-23 MED ORDER — INSULIN ASPART PROT & ASPART (70-30 MIX) 100 UNIT/ML ~~LOC~~ SUSP: 25.0000 [IU] | Freq: Every day | SUBCUTANEOUS | Status: DC

## 2022-06-23 NOTE — Progress Notes (Signed)
[facility] PROGRESS NOTE    Progress Note  Amanda Kent  ZOX:[id number] DOB: 1995/08/07 DOA: 06/20/2022 PCP: Patient, No Pcp Per     Brief Narrative:   Amanda Kent is an 27 y.o. female past medical history of non-insulin-dependent diabetes mellitus who comes in in DKA found to have a BVG less than 7 and in DKA.  She has been noncompliant with her insulin due to financial reasons and was prescribed more than a month ago she had not been taking it.   Assessment/Plan:   DKA (diabetic ketoacidosis) (HCC) Bicarbonate still low continue IV insulin and D5. Question renal tubular acidosis, will allow 24 more hours of IV insulin and D5. Continue strict I's and O's and daily weights. Continue regular diet. Out of bed to chair.  Acute kidney injury: Likely due to DKA, resolved with fluid resuscitation.  Leukocytosis possibly due to UTI: Leukocytosis has resolved, urine culture grew less than 30,000 colonies of streptococci. Discontinue IV Rocephin.  Normocytic anemia: 11.6 follow-up with the PCP as an outpatient.  Bacterial vaginosis: Continue Flagyl and fluconazole  Hypovolemic hyponatremia: Resolved, with IV fluids.  Sinus tachycardia: Now resolved due to DKA.  DVT prophylaxis: lovenox Family Communication: Mother Status is: Inpatient Remains inpatient appropriate because: DKA    Code Status:     Code Status Orders  (From admission, onward)           Start     Ordered   06/20/22 1905  Full code  Continuous       Question:  By:  Answer:  Other   06/20/22 1910           Code Status History     Date Active Date Inactive Code Status Order ID Comments User Context   01/26/2018 0211 01/27/2018 1343 Full Code [id number]  Eduard Clos, MD ED         IV Access:   Peripheral IV   Procedures and diagnostic studies:   US PELVIS (TRANSABDOMINAL ONLY)  Result Date: 06/21/2022 CLINICAL DATA:  [id number] Pelvic pain [id number] EXAM:  TRANSABDOMINAL ULTRASOUND OF PELVIS TECHNIQUE: Transabdominal ultrasound examination of the pelvis was performed including evaluation of the uterus, ovaries, adnexal regions, and pelvic cul-de-sac. Patient declined transvaginal imaging. COMPARISON:  None Available. FINDINGS: Uterus Measurements: 6.9 x 2.5 x 4.4 cm = volume: 39 mL. No fibroids or other mass visualized. Endometrium Thickness: 4 mm.  No focal abnormality visualized. Right ovary Not visualized.  Obscured by shadowing bowel gas. Left ovary Measurements: 2.4 x 2.5 x 2.1 cm = volume: 5 mL. Normal appearance/no adnexal mass. Other findings:  No abnormal free fluid. IMPRESSION: 1. Unremarkable sonographic appearance of the uterus and left ovary. 2. Nonvisualization of the right ovary. Electronically Signed   By: Duanne Guess D.O.   On: 06/21/2022 09:13     Medical Consultants:   None.   Subjective:    [employer] feels better.  Objective:    Vitals:   06/22/22 2200 06/22/22 2300 06/23/22 0000 06/23/22 0400  BP: (!) 157/104 (!) 144/102 (!) 140/101 117/87  Pulse: (!) 107 (!) 104 (!) 103 96  Resp: (!) 27 (!) 27 (!) 30 (!) 23  Temp:   98.7 F (37.1 C) 98.4 F (36.9 C)  TempSrc:   Oral Oral  SpO2: 100% 100% 100% 99%  Weight:      Height:       SpO2: 99 %   Intake/Output Summary (Last 24 hours) at 06/23/2022 0719 Last data filed  at 06/23/2022 0020 Gross per 24 hour  Intake 1995.6 ml  Output --  Net 1995.6 ml    Filed Weights   06/20/22 2120  Weight: 84.7 kg    Exam: General exam: In no acute distress. Respiratory system: Good air movement and clear to auscultation. Cardiovascular system: S1 & S2 heard, RRR. No JVD. Gastrointestinal system: Abdomen is nondistended, soft and nontender.  Extremities: No pedal edema. Skin: No rashes, lesions or ulcers Psychiatry: Judgement and insight appear normal. Mood & affect appropriate. Data Reviewed:    Labs: Basic Metabolic Panel: Recent Labs  Lab  06/20/22 1735 06/20/22 2246 06/21/22 1449 06/22/22 0409 06/22/22 0840 06/22/22 1858 06/23/22 0115  NA 131*   < > 138 138 138 135 139  K 4.5   < > 3.4* 3.1* 2.8* 2.9* 3.7  CL 104   < > 114* 116* 114* 110 113*  CO2 <7*   < > 15* 16* 15* 18* 17*  GLUCOSE 447*   < > 220* 158* 228* 178* 177*  BUN 26*   < > 22* 20 18 16 14   CREATININE 1.12*   < > 0.73 0.70 0.68 0.65 0.62  CALCIUM 8.6*   < > 8.1* 7.9* 8.1* 7.9* 7.9*  MG 2.4  --   --   --   --   --   --    < > = values in this interval not displayed.    GFR Estimated Creatinine Clearance: 114.9 mL/min (by C-G formula based on SCr of 0.62 mg/dL). Liver Function Tests: Recent Labs  Lab 06/20/22 1735  AST 11*  ALT 14  ALKPHOS 82  BILITOT 1.4*  PROT 9.1*  ALBUMIN 3.8    Recent Labs  Lab 06/20/22 1735  LIPASE 57*    No results for input(s): "AMMONIA" in the last 168 hours. Coagulation profile No results for input(s): "INR", "PROTIME" in the last 168 hours. COVID-19 Labs  No results for input(s): "DDIMER", "FERRITIN", "LDH", "CRP" in the last 72 hours.  No results found for: "SARSCOV2NAA"  CBC: Recent Labs  Lab 06/20/22 1735 06/21/22 0147 06/22/22 0258  WBC 20.7* 21.9* 10.8*  HGB 14.7 14.6 11.6*  HCT 48.9* 47.6* 34.6*  MCV 93.7 92.4 85.0  PLT 306 265 226    Cardiac Enzymes: No results for input(s): "CKTOTAL", "CKMB", "CKMBINDEX", "TROPONINI" in the last 168 hours. BNP (last 3 results) No results for input(s): "PROBNP" in the last 8760 hours. CBG: Recent Labs  Lab 06/23/22 0041 06/23/22 0144 06/23/22 0245 06/23/22 0342 06/23/22 0550  GLUCAP 197* 146* 141* 144* 134*    D-Dimer: No results for input(s): "DDIMER" in the last 72 hours. Hgb A1c: Recent Labs    06/21/22 1917  HGBA1C 14.2*    Lipid Profile: No results for input(s): "CHOL", "HDL", "LDLCALC", "TRIG", "CHOLHDL", "LDLDIRECT" in the last 72 hours. Thyroid function studies: No results for input(s): "TSH", "T4TOTAL", "T3FREE", "THYROIDAB"  in the last 72 hours.  Invalid input(s): "FREET3" Anemia work up: No results for input(s): "VITAMINB12", "FOLATE", "FERRITIN", "TIBC", "IRON", "RETICCTPCT" in the last 72 hours. Sepsis Labs: Recent Labs  Lab 06/20/22 1735 06/21/22 0147 06/22/22 0258  WBC 20.7* 21.9* 10.8*    Microbiology Recent Results (from the past 240 hour(s))  Urine Culture (for pregnant, neutropenic or urologic patients or patients with an indwelling urinary catheter)     Status: Abnormal   Collection Time: 06/20/22  3:28 PM   Specimen: Urine, Clean Catch  Result Value Ref Range Status   Specimen Description  Final    URINE, CLEAN CATCH Performed at [facility], [address], [place], [place] [address]    Special Requests   Final    NONE Performed at [facility], [address], [place], [place] [address]    Culture (A)  Final    30,000 COLONIES/mL STREPTOCOCCUS AGALACTIAE TESTING AGAINST S. AGALACTIAE NOT ROUTINELY PERFORMED DUE TO PREDICTABILITY OF AMP/PEN/VAN SUSCEPTIBILITY. Performed at [facility], [address], [place], [place] [address]    Report Status 06/22/2022 FINAL  Final  MRSA Next Gen by PCR, Nasal     Status: None   Collection Time: 06/20/22  9:24 PM   Specimen: Nasal Mucosa; Nasal Swab  Result Value Ref Range Status   MRSA by PCR Next Gen NOT DETECTED NOT DETECTED Final    Comment: (NOTE) The GeneXpert MRSA Assay (FDA approved for NASAL specimens only), is one component of a comprehensive MRSA colonization surveillance program. It is not intended to diagnose MRSA infection nor to guide or monitor treatment for MRSA infections. Test performance is not FDA approved in patients less than 84 years old. Performed at [facility], [address], [place], [place] [address]      Medications:    Chlorhexidine Gluconate Cloth  6 each Topical Daily   heparin  5,000 Units Subcutaneous Q8H   metroNIDAZOLE  500 mg  Oral Q12H   phenazopyridine  100 mg Oral TID WC   potassium chloride  40 mEq Oral TID   Continuous Infusions:  sodium chloride Stopped (06/21/22 0652)   cefTRIAXone (ROCEPHIN)  IV Stopped (06/22/22 0859)   dextrose 5 % and 0.45 % NaCl 75 mL/hr at 06/23/22 0429   insulin 2.2 Units/hr (06/23/22 0553)      LOS: 3 days   Marinda Elk  [facility]  06/23/2022, 7:19 AM

## 2022-06-23 NOTE — Progress Notes (Addendum)
Report called to receiving RN [id number]. Patient with no complaints at this time. Will transfer via WC.

## 2022-06-23 NOTE — Progress Notes (Signed)
Order for Tele monitor has expired 7 hours ago from ICU but there seem to be old one from 6.13.24.  Do you want to continue it.  and some laxative for bm.  has been few days since LBM

## 2022-06-24 DIAGNOSIS — E1310 Other specified diabetes mellitus with ketoacidosis without coma: Secondary | ICD-10-CM

## 2022-06-24 LAB — BASIC METABOLIC PANEL
Anion gap: 6 (ref 5–15)
BUN: 12 mg/dL (ref 6–20)
CO2: 23 mmol/L (ref 22–32)
Calcium: 7.9 mg/dL — ABNORMAL LOW (ref 8.9–10.3)
Chloride: 107 mmol/L (ref 98–111)
Creatinine, Ser: 0.54 mg/dL (ref 0.44–1.00)
GFR, Estimated: >60 mL/min (ref >60)
Glucose, Bld: 271 mg/dL — ABNORMAL HIGH (ref 70–99)
Potassium: 3.7 mmol/L (ref 3.5–5.1)
Sodium: 136 mmol/L (ref 135–145)

## 2022-06-24 LAB — CYTOLOGY - PAP
Comment: NEGATIVE
Diagnosis: UNDETERMINED — AB
High risk HPV: NEGATIVE

## 2022-06-24 LAB — GLUCOSE, CAPILLARY: Glucose-Capillary: 274 mg/dL — ABNORMAL HIGH (ref 70–99)

## 2022-06-24 MED ORDER — "PEN NEEDLES 3/16"" 31G X 5 MM MISC": 1 refills | Status: AC

## 2022-06-24 MED ORDER — NOVOLIN 70/30 FLEXPEN RELION (70-30) 100 UNIT/ML ~~LOC~~ SUPN: 30.0000 [IU] | PEN_INJECTOR | Freq: Two times a day (BID) | SUBCUTANEOUS | 2 refills | Status: AC

## 2022-06-24 NOTE — Discharge Summary (Signed)
Physician Discharge Summary  Amanda Kent:[id number] DOB: 1995/05/08 DOA: 06/20/2022  PCP: Patient, No Pcp Per-- see Dr. Ladona Ridgel in [place]  Admit date: 06/20/2022 Discharge date: 06/24/2022  Admitted From: home Discharge disposition: home   Recommendations for Outpatient Follow-Up:   Patient to bring in blood sugar log and will need adjustment   Discharge Diagnosis:   Principal Problem:   DKA (diabetic ketoacidosis) (HCC)    Discharge Condition: Improved.  Diet recommendation:  Carbohydrate-modified.   Wound care: None.  Code status: Full.   History of Present Illness:   27 year old woman with a history of diabetes, non-insulin-dependent at present presenting with 1 to 2 days of worsening shortness of breath, nausea vomiting.  Yesterday went to her gynecologist with pelvic pain and was diagnosed with bacterial vaginosis as well as Candida vaginitis.   In the ER found to have a VBG less than 7, hyperglycemia, elevated anion gap.  Diagnosis is DKA and PCCM is consulted for admission.   Hospital Course by Problem:   DKA (diabetic ketoacidosis) (HCC) Resolved with insulin -plan for d/c on 70/30 will need close outpatient follow up   Acute kidney injury: Likely due to DKA, resolved with fluid resuscitation.   Leukocytosis possibly due to UTI: Leukocytosis has resolved, urine culture grew less than 30,000 colonies of streptococci. Discontinue IV Rocephin.   Normocytic anemia: 11.6 follow-up with the PCP as an outpatient.   Bacterial vaginosis: Continue outpatient treatment   Hypovolemic hyponatremia: Resolved, with IV fluids.   Sinus tachycardia: Now resolved due to DKA.    Medical Consultants:   PCCM DM coordinator   Discharge Exam:   Vitals:   06/23/22 2018 06/24/22 0639  BP: 120/82 (!) 130/93  Pulse: 97 94  Resp: 18 18  Temp: 98 F (36.7 C) 98.5 F (36.9 C)  SpO2: 99% 100%   Vitals:   06/23/22 1631 06/23/22 1631 06/23/22  2018 06/24/22 0639  BP: (!) 132/97  120/82 (!) 130/93  Pulse: (!) 113 (!) 110 97 94  Resp: 16  18 18   Temp: 98.6 F (37 C)  98 F (36.7 C) 98.5 F (36.9 C)  TempSrc: Oral  Oral Oral  SpO2: 99% 99% 99% 100%  Weight:      Height:        General exam: Appears calm and comfortable.     The results of significant diagnostics from this hospitalization (including imaging, microbiology, ancillary and laboratory) are listed below for reference.     Procedures and Diagnostic Studies:   US PELVIS (TRANSABDOMINAL ONLY)  Result Date: 06/21/2022 CLINICAL DATA:  [id number] Pelvic pain [id number] EXAM: TRANSABDOMINAL ULTRASOUND OF PELVIS TECHNIQUE: Transabdominal ultrasound examination of the pelvis was performed including evaluation of the uterus, ovaries, adnexal regions, and pelvic cul-de-sac. Patient declined transvaginal imaging. COMPARISON:  None Available. FINDINGS: Uterus Measurements: 6.9 x 2.5 x 4.4 cm = volume: 39 mL. No fibroids or other mass visualized. Endometrium Thickness: 4 mm.  No focal abnormality visualized. Right ovary Not visualized.  Obscured by shadowing bowel gas. Left ovary Measurements: 2.4 x 2.5 x 2.1 cm = volume: 5 mL. Normal appearance/no adnexal mass. Other findings:  No abnormal free fluid. IMPRESSION: 1. Unremarkable sonographic appearance of the uterus and left ovary. 2. Nonvisualization of the right ovary. Electronically Signed   By: Duanne Guess D.O.   On: 06/21/2022 09:13   DG Chest Port 1 View  Result Date: 06/20/2022 CLINICAL DATA:  Shortness of breath EXAM: PORTABLE CHEST 1 VIEW COMPARISON:  X-ray 01/26/2018 FINDINGS: Underinflation. No consolidation, pneumothorax or effusion. No edema. Normal cardiopericardial silhouette. Overlapping cardiac leads. IMPRESSION: No acute cardiopulmonary disease. Electronically Signed   By: Karen Kays M.D.   On: 06/20/2022 18:57     Labs:   Basic Metabolic Panel: Recent Labs  Lab 06/20/22 1735 06/20/22 2246 06/22/22 0840  06/22/22 1858 06/23/22 0115 06/23/22 0727 06/24/22 0444  NA 131*   < > 138 135 139 138 136  K 4.5   < > 2.8* 2.9* 3.7 3.0* 3.7  CL 104   < > 114* 110 113* 111 107  CO2 <7*   < > 15* 18* 17* 20* 23  GLUCOSE 447*   < > 228* 178* 177* 157* 271*  BUN 26*   < > 18 16 14 11 12   CREATININE 1.12*   < > 0.68 0.65 0.62 0.57 0.54  CALCIUM 8.6*   < > 8.1* 7.9* 7.9* 7.7* 7.9*  MG 2.4  --   --   --   --   --   --    < > = values in this interval not displayed.   GFR Estimated Creatinine Clearance: 114.9 mL/min (by C-G formula based on SCr of 0.54 mg/dL). Liver Function Tests: Recent Labs  Lab 06/20/22 1735  AST 11*  ALT 14  ALKPHOS 82  BILITOT 1.4*  PROT 9.1*  ALBUMIN 3.8   Recent Labs  Lab 06/20/22 1735  LIPASE 57*   No results for input(s): "AMMONIA" in the last 168 hours. Coagulation profile No results for input(s): "INR", "PROTIME" in the last 168 hours.  CBC: Recent Labs  Lab 06/20/22 1735 06/21/22 0147 06/22/22 0258 06/23/22 0727  WBC 20.7* 21.9* 10.8* 7.9  HGB 14.7 14.6 11.6* 11.6*  HCT 48.9* 47.6* 34.6* 34.8*  MCV 93.7 92.4 85.0 84.5  PLT 306 265 226 235   Cardiac Enzymes: No results for input(s): "CKTOTAL", "CKMB", "CKMBINDEX", "TROPONINI" in the last 168 hours. BNP: Invalid input(s): "POCBNP" CBG: Recent Labs  Lab 06/23/22 1030 06/23/22 1212 06/23/22 1735 06/23/22 2019 06/24/22 0825  GLUCAP 250* 233* 274* 248* 274*   D-Dimer No results for input(s): "DDIMER" in the last 72 hours. Hgb A1c Recent Labs    06/21/22 1917  HGBA1C 14.2*   Lipid Profile No results for input(s): "CHOL", "HDL", "LDLCALC", "TRIG", "CHOLHDL", "LDLDIRECT" in the last 72 hours. Thyroid function studies No results for input(s): "TSH", "T4TOTAL", "T3FREE", "THYROIDAB" in the last 72 hours.  Invalid input(s): "FREET3" Anemia work up No results for input(s): "VITAMINB12", "FOLATE", "FERRITIN", "TIBC", "IRON", "RETICCTPCT" in the last 72 hours. Microbiology Recent Results  (from the past 240 hour(s))  Urine Culture (for pregnant, neutropenic or urologic patients or patients with an indwelling urinary catheter)     Status: Abnormal   Collection Time: 06/20/22  3:28 PM   Specimen: Urine, Clean Catch  Result Value Ref Range Status   Specimen Description   Final    URINE, CLEAN CATCH Performed at [facility], [address] [place], [place] [address]    Special Requests   Final    NONE Performed at [facility], [address], [place], [place] [address]    Culture (A)  Final    30,000 COLONIES/mL STREPTOCOCCUS AGALACTIAE TESTING AGAINST S. AGALACTIAE NOT ROUTINELY PERFORMED DUE TO PREDICTABILITY OF AMP/PEN/VAN SUSCEPTIBILITY. Performed at [facility], [address], [place], [place] [address]    Report Status 06/22/2022 FINAL  Final  MRSA Next Gen by PCR, Nasal  Status: None   Collection Time: 06/20/22  9:24 PM   Specimen: Nasal Mucosa; Nasal Swab  Result Value Ref Range Status   MRSA by PCR Next Gen NOT DETECTED NOT DETECTED Final    Comment: (NOTE) The GeneXpert MRSA Assay (FDA approved for NASAL specimens only), is one component of a comprehensive MRSA colonization surveillance program. It is not intended to diagnose MRSA infection nor to guide or monitor treatment for MRSA infections. Test performance is not FDA approved in patients less than 41 years old. Performed at [facility], [address], [place], [place] [address]      Discharge Instructions:   Discharge Instructions     Diet Carb Modified   Complete by: As directed    Discharge instructions   Complete by: As directed    Bring log of blood sugars to PCP Reli-on brand is at [facility]-- take prescription there   Increase activity slowly   Complete by: As directed       Allergies as of 06/24/2022   No Known Allergies      Medication List     TAKE these medications    blood glucose meter kit and  supplies Dispense based on patient and insurance preference. Use up to four times daily as directed. (FOR ICD-10 E10.9, E11.9).   doxycycline 100 MG capsule Commonly known as: VIBRAMYCIN Take 1 capsule (100 mg total) by mouth 2 (two) times daily.   fluconazole 200 MG tablet Commonly known as: Diflucan Take 1 tablet (200 mg total) by mouth every 3 (three) days.   ibuprofen 800 MG tablet Commonly known as: ADVIL Take 1 tablet (800 mg total) by mouth every 8 (eight) hours as needed.   metroNIDAZOLE 500 MG tablet Commonly known as: FLAGYL Take 1 tablet (500 mg total) by mouth 2 (two) times daily. What changed: Another medication with the same name was removed. Continue taking this medication, and follow the directions you see here.   NovoLIN 70/30 Kwikpen (70-30) 100 UNIT/ML KwikPen Generic drug: insulin isophane & regular human KwikPen Inject 30 Units into the skin 2 (two) times daily.          Time coordinating discharge: 45 min  Signed:  Joseph Art DO  [facility] 06/24/2022, 9:12 AM

## 2022-06-24 NOTE — TOC Transition Note (Signed)
Transition of Care [facility]) - CM/SW Discharge Note   Patient Details  Name: Amanda Kent MRN: [id number] Date of Birth: 1995/12/14  Transition of [facility]) CM/SW Contact:  Otelia Santee, LCSW Phone Number: 06/24/2022, 11:20 AM   Clinical Narrative:    Met with pt who shares she has a PCP with Dr. Mayford Knife at [facility] [place]. Pt denied further needs.    Final next level of care: Home/Self Care Barriers to Discharge: No Barriers Identified   Patient Goals and CMS Choice CMS Medicare.gov Compare Post Acute Care list provided to:: Patient Choice offered to / list presented to : Patient  Discharge Placement                         Discharge Plan and Services Additional resources added to the After Visit Summary for                  DME Arranged: N/A DME Agency: NA                  Social Determinants of Health (SDOH) Interventions SDOH Screenings   Food Insecurity: No Food Insecurity (06/21/2022)  Housing: Low Risk  (06/21/2022)  Transportation Needs: No Transportation Needs (06/21/2022)  Utilities: Not At Risk (06/21/2022)  Depression (PHQ2-9): Low Risk  (06/19/2022)  Tobacco Use: Low Risk  (06/20/2022)     Readmission Risk Interventions    06/24/2022   11:20 AM  Readmission Risk Prevention Plan  Post Dischage Appt Complete  Medication Screening Complete  Transportation Screening Complete

## 2022-10-25 ENCOUNTER — Telehealth: Payer: Self-pay

## 2022-10-25 NOTE — Telephone Encounter (Signed)
Patient called and left vm stating that she had some questions.  Returned patient call. No answer. Left vm for patient to return call to the office

## 2022-10-29 ENCOUNTER — Encounter (HOSPITAL_COMMUNITY): Payer: Self-pay | Admitting: Emergency Medicine

## 2022-10-29 ENCOUNTER — Emergency Department (HOSPITAL_COMMUNITY)
Admission: EM | Admit: 2022-10-29 | Discharge: 2022-10-30 | Disposition: A | Discharge status: Home / Self Care | Payer: Medicaid Other | Attending: Emergency Medicine | Admitting: Emergency Medicine

## 2022-10-29 ENCOUNTER — Other Ambulatory Visit: Payer: Self-pay

## 2022-10-29 DIAGNOSIS — E119 Type 2 diabetes mellitus without complications: Secondary | ICD-10-CM | POA: Diagnosis not present

## 2022-10-29 DIAGNOSIS — R102 Pelvic and perineal pain: Secondary | ICD-10-CM | POA: Diagnosis present

## 2022-10-29 DIAGNOSIS — J45909 Unspecified asthma, uncomplicated: Secondary | ICD-10-CM | POA: Insufficient documentation

## 2022-10-29 DIAGNOSIS — B3749 Other urogenital candidiasis: Secondary | ICD-10-CM | POA: Insufficient documentation

## 2022-10-29 DIAGNOSIS — R300 Dysuria: Secondary | ICD-10-CM | POA: Insufficient documentation

## 2022-10-29 LAB — URINALYSIS, ROUTINE W REFLEX MICROSCOPIC
Bilirubin Urine: NEGATIVE
Glucose, UA: >=500 mg/dL — AB
Ketones, ur: 5 mg/dL — AB
Nitrite: NEGATIVE
Protein, ur: NEGATIVE mg/dL
Specific Gravity, Urine: 1.02 (ref 1.005–1.030)
pH: 5 (ref 5.0–8.0)

## 2022-10-29 LAB — WET PREP, GENITAL
Clue Cells Wet Prep HPF POC: NONE SEEN
Sperm: NONE SEEN
Trich, Wet Prep: NONE SEEN
WBC, Wet Prep HPF POC: >=10 — AB (ref <10)
Yeast Wet Prep HPF POC: NONE SEEN

## 2022-10-29 LAB — PREGNANCY, URINE: Preg Test, Ur: NEGATIVE

## 2022-10-29 NOTE — ED Triage Notes (Signed)
  Patient comes in with vaginal pain and discharge that has been going on for 4 days.  Patient states she thought it was a yeast infection and took OTC medication that cleared up discharge but still having pelvic pain and dysuria.  Patient states she had diflucan and doxycyline from previous encounter and took a dose of that today to try and help symptoms.  Took 800 mg ibuprofen around 1400 today.  Pain 6/10, burning.

## 2022-10-29 NOTE — ED Provider Triage Note (Signed)
Emergency Medicine Provider Triage Evaluation Note  Amanda Kent , a 27 y.o. female  was evaluated in triage.  Pt complains of vaginal pain, onset 4 days ago. Initially had vaginal discharge, took OTC yeast medication and discharge subsided. Continues to have burning vaginal pain. Not sexually active. Endorses vaginal irritation.  Review of Systems  Positive: Vaginal pain Negative: Abdominal pain  Physical Exam  BP (!) 145/104 (BP Location: Right Arm)   Pulse 99   Temp 99.2 F (37.3 C) (Oral)   Resp 18   Ht 5\' 6"  (1.676 m)   Wt 84.4 kg   LMP 09/18/2022   SpO2 100%   BMI 30.02 kg/m  Gen:   Awake, no distress   Resp:  Normal effort  MSK:   Moves extremities without difficulty  Other:    Medical Decision Making  Medically screening exam initiated at 8:09 PM.  Appropriate orders placed.  Amanda Kent was informed that the remainder of the evaluation will be completed by another provider, this initial triage assessment does not replace that evaluation, and the importance of remaining in the ED until their evaluation is complete.     Felicie Morn, NP 10/29/22 2011

## 2022-10-29 NOTE — ED Provider Notes (Signed)
[place] EMERGENCY DEPARTMENT AT [facility] Provider Note   CSN: [id number] Arrival date & time: 10/29/22  1941     History {Add pertinent medical, surgical, social history, OB history to HPI:1} Chief Complaint  Patient presents with   Vaginal Pain    Damaya Keim Grewe is a 27 y.o. female with past medical history significant for type 2 diabetes, asthma presents to the ED complaining of vaginal pain and discharge for the last 4 days.  Patient thought she had a yeast infection and took over-the-counter medication (Monistat) that cleared of discharge, but she is still having pelvic pain and dysuria.  Patient states that she had some leftover Diflucan and doxycycline from previous encounter and has been taking that the last few days to try and help with her symptoms.  She also took 800 mg of ibuprofen around 1400 today.  She states she is not currently sexually active and does not have concerns for STD/STI.  Denies vaginal bleeding, flank pain, abdominal pain, urinary frequency/urgency, fever.         Home Medications Prior to Admission medications   Medication Sig Start Date End Date Taking? Authorizing Provider  blood glucose meter kit and supplies Dispense based on patient and insurance preference. Use up to four times daily as directed. (FOR ICD-10 E10.9, E11.9). Patient not taking: No sig reported 01/27/18   Regalado, Belkys A, MD  doxycycline (VIBRAMYCIN) 100 MG capsule Take 1 capsule (100 mg total) by mouth 2 (two) times daily. 06/19/22   Brock Bad, MD  fluconazole (DIFLUCAN) 200 MG tablet Take 1 tablet (200 mg total) by mouth every 3 (three) days. 06/19/22   Brock Bad, MD  ibuprofen (ADVIL) 800 MG tablet Take 1 tablet (800 mg total) by mouth every 8 (eight) hours as needed. 06/19/22   Brock Bad, MD  insulin isophane & regular human KwikPen (NOVOLIN 70/30 KWIKPEN) (70-30) 100 UNIT/ML KwikPen Inject 30 Units into the skin 2 (two) times daily. 06/24/22    Joseph Art, DO  Insulin Pen Needle (PEN NEEDLES 3/16") 31G X 5 MM MISC For BID insulin use 06/24/22   Marlin Canary U, DO  metroNIDAZOLE (FLAGYL) 500 MG tablet Take 1 tablet (500 mg total) by mouth 2 (two) times daily. 06/19/22   Brock Bad, MD      Allergies    Patient has no known allergies.    Review of Systems   Review of Systems  Constitutional:  Negative for fever.  Gastrointestinal:  Negative for abdominal pain.  Genitourinary:  Positive for dysuria, pelvic pain, vaginal discharge and vaginal pain. Negative for flank pain and vaginal bleeding.    Physical Exam Updated Vital Signs BP 121/63   Pulse 86   Temp 99.2 F (37.3 C) (Oral)   Resp 18   Ht 5\' 6"  (1.676 m)   Wt 84.4 kg   LMP 09/18/2022   SpO2 98%   BMI 30.02 kg/m  Physical Exam Vitals and nursing note reviewed. Exam conducted with a chaperone present Simonne Come, [place]).  Constitutional:      General: She is not in acute distress.    Appearance: Normal appearance. She is not ill-appearing or diaphoretic.  Cardiovascular:     Rate and Rhythm: Normal rate and regular rhythm.  Pulmonary:     Effort: Pulmonary effort is normal.  Abdominal:     General: Abdomen is flat.     Palpations: Abdomen is soft.     Tenderness: There is no  abdominal tenderness.     Hernia: There is no hernia in the left inguinal area or right inguinal area.  Genitourinary:    General: Normal vulva.     Exam position: Lithotomy position.     Pubic Area: No rash.      Labia:        Right: No rash, tenderness or lesion.        Left: No rash, tenderness or lesion.      Vagina: Vaginal discharge (white, milky) and tenderness present. No bleeding.     Cervix: Dilated. Discharge (white, milky) present. No cervical motion tenderness, friability, erythema or cervical bleeding.     Uterus: Normal.      Adnexa:        Right: No tenderness.         Left: No tenderness.    Lymphadenopathy:     Lower Body: No right inguinal  adenopathy. No left inguinal adenopathy.  Skin:    General: Skin is warm and dry.     Capillary Refill: Capillary refill takes less than 2 seconds.  Neurological:     Mental Status: She is alert. Mental status is at baseline.  Psychiatric:        Mood and Affect: Mood normal.        Behavior: Behavior normal.     ED Results / Procedures / Treatments   Labs (all labs ordered are listed, but only abnormal results are displayed) Labs Reviewed  URINALYSIS, ROUTINE W REFLEX MICROSCOPIC - Abnormal; Notable for the following components:      Result Value   APPearance CLOUDY (*)    Glucose, UA >=500 (*)    Hgb urine dipstick SMALL (*)    Ketones, ur 5 (*)    Leukocytes,Ua LARGE (*)    Bacteria, UA FEW (*)    All other components within normal limits  WET PREP, GENITAL  PREGNANCY, URINE  RPR  HIV ANTIBODY (ROUTINE TESTING W REFLEX)  GC/CHLAMYDIA PROBE AMP ([place]) NOT AT [facility]    EKG None  Radiology No results found.  Procedures Procedures  {Document cardiac monitor, telemetry assessment procedure when appropriate:1}  Medications Ordered in ED Medications - No data to display  ED Course/ Medical Decision Making/ A&P   {   Click here for ABCD2, HEART and other calculatorsREFRESH Note before signing :1}                              Medical Decision Making Amount and/or Complexity of Data Reviewed Labs: ordered.   This patient presents to the ED with chief complaint(s) of pelvic pain, vaginal discharge, dysuria with non-contributory past medical history. The complaint involves an extensive differential diagnosis and also carries with it a high risk of complications and morbidity.    The differential diagnosis includes BV, vaginal candidiasis, STD/STI   The initial plan is to perform pelvic, obtain UA  Initial Assessment:   Exam significant for overall well-appearing patient is not in acute distress.  Abdomen is soft and nontender to palpation.  Pelvic exam was  performed.  There is a milky white discharge in the vaginal vault coming from the cervical os.  Independent ECG/labs interpretation:  The following labs were independently interpreted:  UA with large amount of leukocytes, few bacteria, and budding yeast present.  Pregnancy negative.  Wet prep ***.  Discussed collecting GC/chlamydia with patient, who verbalized her agreement to this testing.  HIV and RPR  testing was also ordered, however, blood was unable to be obtained.   Disposition:   ***  Social Determinants of Health:   Patient's {ZOXW:96045}  increases the complexity of managing their presentation   {Document critical care time when appropriate:1} {Document review of labs and clinical decision tools ie heart score, Chads2Vasc2 etc:1}  {Document your independent review of radiology images, and any outside records:1} {Document your discussion with family members, caretakers, and with consultants:1} {Document social determinants of health affecting pt's care:1} {Document your decision making why or why not admission, treatments were needed:1} Final Clinical Impression(s) / ED Diagnoses Final diagnoses:  None    Rx / DC Orders ED Discharge Orders     None

## 2022-10-30 LAB — GC/CHLAMYDIA PROBE AMP (~~LOC~~) NOT AT ARMC
Chlamydia: NEGATIVE
Comment: NEGATIVE
Comment: NORMAL
Neisseria Gonorrhea: NEGATIVE

## 2022-10-30 MED ORDER — PHENAZOPYRIDINE HCL 200 MG PO TABS: 200.0000 mg | ORAL_TABLET | Freq: Three times a day (TID) | ORAL | 0 refills | Status: AC

## 2022-10-30 MED ORDER — FLUCONAZOLE 200 MG PO TABS: 200.0000 mg | ORAL_TABLET | Freq: Every day | ORAL | 0 refills | Status: AC

## 2022-10-30 MED ORDER — FLUCONAZOLE 200 MG PO TABS
200.0000 mg | ORAL_TABLET | Freq: Once | ORAL | Status: AC
  Administered 2022-10-30: 200 mg via ORAL
  Filled 2022-10-30: qty 1

## 2022-10-30 NOTE — Discharge Instructions (Addendum)
Thank you for allowing [place] to be a part of your care today. Your urine tested positive for yeast, however, your wet prep did not. This is likely a urinary tract infection with yeast. Treatment for this is a week of fluconazole. I have sent the medication to your pharmacy.   Please follow up with your primary care doctor as needed, especially if your symptoms do not begin to improve.  Return to the ED if you develop sudden worsening of your symptoms or if you have new concerns.   You will be notified of pending test results via MyChart. If tests return positive, the appropriate treatment will be prescribed to you.

## 2022-10-31 LAB — URINE CULTURE: Culture: 80000 — AB

## 2022-11-01 ENCOUNTER — Telehealth (HOSPITAL_BASED_OUTPATIENT_CLINIC_OR_DEPARTMENT_OTHER): Payer: Self-pay | Admitting: *Deleted

## 2022-11-01 NOTE — Telephone Encounter (Signed)
Post ED Visit - Positive Culture Follow-up  Culture report reviewed by antimicrobial stewardship pharmacist: [facility] []  Enzo Bi, Pharm.D. []  Celedonio Miyamoto, Pharm.D., BCPS AQ-ID []  Garvin Fila, Pharm.D., BCPS []  Georgina Pillion, [address].D., BCPS []  Ramos, [address].D., BCPS, AAHIVP []  Estella Husk, Pharm.D., BCPS, AAHIVP []  Lysle Pearl, PharmD, BCPS []  Phillips Climes, PharmD, BCPS []  Agapito Games, PharmD, BCPS []  Verlan Friends, PharmD []  Mervyn Gay, PharmD, BCPS []  Vinnie Level, PharmD  [facility] [x]  Len Childs, PharmD []  Greer Pickerel, PharmD []  Adalberto Cole, PharmD []  Perlie Gold, Rph []  Lonell Face) Jean Rosenthal, PharmD []  Earl Many, PharmD []  Junita Push, PharmD []  Dorna Leitz, PharmD []  Terrilee Files, PharmD []  Lynann Beaver, PharmD []  Keturah Barre, PharmD []  Loralee Pacas, PharmD []  Bernadene Person, PharmD   Positive urine culture Treated with fluconazole, organism sensitive to the same and no further patient follow-up is required at this time.  Nena Polio Garner Nash 11/01/2022, 1:46 PM
